# Patient Record
Sex: Male | Born: 1955 | Race: White | Hispanic: No | Smoking: Former smoker
Health system: Southern US, Community
[De-identification: ages and names within clinical notes are randomized; demographics above are authoritative.]

## PROBLEM LIST (undated history)

## (undated) DIAGNOSIS — M1990 Unspecified osteoarthritis, unspecified site: Secondary | ICD-10-CM

## (undated) DIAGNOSIS — R569 Unspecified convulsions: Secondary | ICD-10-CM

## (undated) DIAGNOSIS — I10 Essential (primary) hypertension: Secondary | ICD-10-CM

## (undated) DIAGNOSIS — E785 Hyperlipidemia, unspecified: Secondary | ICD-10-CM

## (undated) DIAGNOSIS — L409 Psoriasis, unspecified: Secondary | ICD-10-CM

## (undated) DIAGNOSIS — Z87442 Personal history of urinary calculi: Secondary | ICD-10-CM

## (undated) DIAGNOSIS — Z9289 Personal history of other medical treatment: Secondary | ICD-10-CM

## (undated) DIAGNOSIS — I669 Occlusion and stenosis of unspecified cerebral artery: Secondary | ICD-10-CM

## (undated) DIAGNOSIS — K219 Gastro-esophageal reflux disease without esophagitis: Secondary | ICD-10-CM

## (undated) HISTORY — PX: OTHER SURGICAL HISTORY: SHX169

## (undated) HISTORY — PX: SHOULDER ARTHROTOMY: SHX1050

---

## 2011-10-15 ENCOUNTER — Encounter (HOSPITAL_COMMUNITY): Payer: Self-pay | Admitting: Pharmacy Technician

## 2011-10-17 ENCOUNTER — Other Ambulatory Visit: Payer: Self-pay | Admitting: Orthopedic Surgery

## 2011-10-17 NOTE — Progress Notes (Signed)
Dr. Ave Filter : When you are able to, we need orders put into EPIC for [name] [name]  DOS 10/23/11  He is coming for preop TUES 10/21/11 Thank You --Lucien Mons  PST

## 2011-10-21 ENCOUNTER — Ambulatory Visit (HOSPITAL_COMMUNITY)
Admission: RE | Admit: 2011-10-21 | Discharge: 2011-10-21 | Disposition: A | Discharge status: Home / Self Care | Payer: Non-veteran care | Source: Ambulatory Visit | Attending: Orthopedic Surgery | Admitting: Orthopedic Surgery

## 2011-10-21 ENCOUNTER — Encounter (HOSPITAL_COMMUNITY)
Admission: RE | Admit: 2011-10-21 | Discharge: 2011-10-21 | Disposition: A | Discharge status: Home / Self Care | Payer: Non-veteran care | Source: Ambulatory Visit | Attending: Orthopedic Surgery | Admitting: Orthopedic Surgery

## 2011-10-21 ENCOUNTER — Ambulatory Visit (HOSPITAL_COMMUNITY): Payer: Self-pay

## 2011-10-21 ENCOUNTER — Encounter (HOSPITAL_COMMUNITY): Payer: Self-pay

## 2011-10-21 DIAGNOSIS — Z01810 Encounter for preprocedural cardiovascular examination: Secondary | ICD-10-CM | POA: Insufficient documentation

## 2011-10-21 DIAGNOSIS — M19019 Primary osteoarthritis, unspecified shoulder: Secondary | ICD-10-CM | POA: Insufficient documentation

## 2011-10-21 DIAGNOSIS — Z01812 Encounter for preprocedural laboratory examination: Secondary | ICD-10-CM | POA: Insufficient documentation

## 2011-10-21 HISTORY — DX: Unspecified osteoarthritis, unspecified site: M19.90

## 2011-10-21 HISTORY — DX: Hyperlipidemia, unspecified: E78.5

## 2011-10-21 HISTORY — DX: Psoriasis, unspecified: L40.9

## 2011-10-21 HISTORY — DX: Essential (primary) hypertension: I10

## 2011-10-21 HISTORY — DX: Personal history of other medical treatment: Z92.89

## 2011-10-21 HISTORY — DX: Unspecified convulsions: R56.9

## 2011-10-21 HISTORY — DX: Personal history of urinary calculi: Z87.442

## 2011-10-21 LAB — SURGICAL PCR SCREEN: MRSA, PCR: POSITIVE — AB

## 2011-10-21 LAB — CBC WITH DIFFERENTIAL/PLATELET
Eosinophils Absolute: 0.1 10*3/uL (ref 0.0–0.7)
Eosinophils Relative: 2 % (ref 0–5)
Hemoglobin: 15.4 g/dL (ref 13.0–17.0)
Lymphocytes Relative: 38 % (ref 12–46)
Lymphs Abs: 2.6 10*3/uL (ref 0.7–4.0)
MCH: 28.7 pg (ref 26.0–34.0)
MCV: 83.4 fL (ref 78.0–100.0)
Monocytes Relative: 8 % (ref 3–12)
Platelets: 297 10*3/uL (ref 150–400)
RBC: 5.36 MIL/uL (ref 4.22–5.81)
WBC: 6.8 10*3/uL (ref 4.0–10.5)

## 2011-10-21 LAB — PROTIME-INR: Prothrombin Time: 14.3 seconds (ref 11.6–15.2)

## 2011-10-21 LAB — COMPREHENSIVE METABOLIC PANEL
ALT: 38 U/L (ref 0–53)
Alkaline Phosphatase: 63 U/L (ref 39–117)
BUN: 11 mg/dL (ref 6–23)
CO2: 30 mEq/L (ref 19–32)
Calcium: 10.4 mg/dL (ref 8.4–10.5)
GFR calc Af Amer: >90 mL/min (ref >90)
GFR calc non Af Amer: 89 mL/min — ABNORMAL LOW (ref >90)
Glucose, Bld: 109 mg/dL — ABNORMAL HIGH (ref 70–99)
Potassium: 3.7 mEq/L (ref 3.5–5.1)
Sodium: 138 mEq/L (ref 135–145)

## 2011-10-21 LAB — URINALYSIS, ROUTINE W REFLEX MICROSCOPIC
Bilirubin Urine: NEGATIVE
Glucose, UA: NEGATIVE mg/dL
Hgb urine dipstick: NEGATIVE
Ketones, ur: NEGATIVE mg/dL
Protein, ur: NEGATIVE mg/dL
Urobilinogen, UA: 0.2 mg/dL (ref 0.0–1.0)

## 2011-10-21 LAB — ABO/RH: ABO/RH(D): B POS

## 2011-10-21 NOTE — Progress Notes (Signed)
10/21/11 1652  OBSTRUCTIVE SLEEP APNEA  Have you ever been diagnosed with sleep apnea through a sleep study? No  Do you snore loudly (loud enough to be heard through closed doors)?  0  Do you often feel tired, fatigued, or sleepy during the daytime? 0  Has anyone observed you stop breathing during your sleep? 0  Do you have, or are you being treated for high blood pressure? 1  BMI more than 35 kg/m2? 0  Age over 58 years old? 1  Neck circumference greater than 40 cm/18 inches? 1  Gender: 1  Obstructive Sleep Apnea Score 4   Score 4 or greater  Results sent to PCP

## 2011-10-21 NOTE — Progress Notes (Signed)
I spoke with Dr. Payton Doughty  concerning EKG and pt's blackout spells. He said pt will be evaluated the day of surgery.

## 2011-10-21 NOTE — Patient Instructions (Addendum)
20 [name] [name]  10/21/2011   Your procedure is scheduled on: 10/23/11 AT 11:30 AM   Report to [facility]  AT: 9:00 AM.  Call this number if you have problems the morning of surgery: [phone]   Remember:   Do not eat food or drink liquids AFTER MIDNIGHT   Take these medicines the morning of surgery with A SIP OF WATER: NONE   Do not wear jewelry, make-up   Do not wear lotions, powders, or perfumes.   Do not shave legs or underarms 12 hrs. before surgery (men may shave face)  Do not bring valuables to the hospital.  Contacts, dentures or bridgework may not be worn into surgery.  Leave suitcase in the car. After surgery it may be brought to your room.  For patients admitted to the hospital more than one night, checkout time is 11:00 AM the day of discharge.   Patients discharged the day of surgery will not be allowed to drive home If going home same day of surgery, must have someone stay with you first  24 hrs at home and arrange for some one to drive you home from hospital.    Special Instructions:   Please read over the following fact sheets that you were given:               1. MRSA  INFORMATION                      2. [place] PREPARING FOR SURGERY SHEET                  3. INCENTIVE SPIROMETER                                  X_______________________________________________________________

## 2011-10-23 ENCOUNTER — Inpatient Hospital Stay (HOSPITAL_COMMUNITY): Payer: Non-veteran care

## 2011-10-23 ENCOUNTER — Inpatient Hospital Stay (HOSPITAL_COMMUNITY)
Admission: RE | Admit: 2011-10-23 | Discharge: 2011-10-25 | DRG: 484 | Disposition: A | Discharge status: Home / Self Care | Payer: Non-veteran care | Source: Ambulatory Visit | Attending: Orthopedic Surgery | Admitting: Orthopedic Surgery

## 2011-10-23 ENCOUNTER — Encounter (HOSPITAL_COMMUNITY)
Admission: RE | Disposition: A | Discharge status: Home / Self Care | Payer: Self-pay | Source: Ambulatory Visit | Attending: Orthopedic Surgery

## 2011-10-23 ENCOUNTER — Encounter (HOSPITAL_COMMUNITY): Payer: Self-pay | Admitting: Anesthesiology

## 2011-10-23 ENCOUNTER — Encounter (HOSPITAL_COMMUNITY): Payer: Self-pay | Admitting: *Deleted

## 2011-10-23 ENCOUNTER — Ambulatory Visit (HOSPITAL_COMMUNITY): Payer: Non-veteran care | Admitting: Anesthesiology

## 2011-10-23 DIAGNOSIS — E785 Hyperlipidemia, unspecified: Secondary | ICD-10-CM | POA: Diagnosis present

## 2011-10-23 DIAGNOSIS — Z23 Encounter for immunization: Secondary | ICD-10-CM

## 2011-10-23 DIAGNOSIS — Z7982 Long term (current) use of aspirin: Secondary | ICD-10-CM

## 2011-10-23 DIAGNOSIS — I10 Essential (primary) hypertension: Secondary | ICD-10-CM | POA: Diagnosis present

## 2011-10-23 DIAGNOSIS — Z79899 Other long term (current) drug therapy: Secondary | ICD-10-CM

## 2011-10-23 DIAGNOSIS — E119 Type 2 diabetes mellitus without complications: Secondary | ICD-10-CM | POA: Diagnosis present

## 2011-10-23 DIAGNOSIS — Z87442 Personal history of urinary calculi: Secondary | ICD-10-CM

## 2011-10-23 DIAGNOSIS — L408 Other psoriasis: Secondary | ICD-10-CM | POA: Diagnosis present

## 2011-10-23 DIAGNOSIS — M25519 Pain in unspecified shoulder: Secondary | ICD-10-CM | POA: Diagnosis present

## 2011-10-23 DIAGNOSIS — M19019 Primary osteoarthritis, unspecified shoulder: Principal | ICD-10-CM | POA: Diagnosis present

## 2011-10-23 DIAGNOSIS — M19011 Primary osteoarthritis, right shoulder: Secondary | ICD-10-CM | POA: Diagnosis present

## 2011-10-23 HISTORY — PX: TOTAL SHOULDER ARTHROPLASTY: SHX126

## 2011-10-23 LAB — GLUCOSE, CAPILLARY: Glucose-Capillary: 111 mg/dL — ABNORMAL HIGH (ref 70–99)

## 2011-10-23 SURGERY — ARTHROPLASTY, SHOULDER, TOTAL
Anesthesia: General | Site: Shoulder | Laterality: Right | Wound class: Clean

## 2011-10-23 MED ORDER — LACTATED RINGERS IV SOLN: INTRAVENOUS | Status: DC

## 2011-10-23 MED ORDER — METOCLOPRAMIDE HCL 10 MG PO TABS: 5.0000 mg | ORAL_TABLET | Freq: Three times a day (TID) | ORAL | Status: DC | PRN

## 2011-10-23 MED ORDER — FENTANYL CITRATE 0.05 MG/ML IJ SOLN
INTRAMUSCULAR | Status: DC | PRN
  Administered 2011-10-23: 50 ug via INTRAVENOUS
  Administered 2011-10-23: 150 ug via INTRAVENOUS

## 2011-10-23 MED ORDER — ACETAMINOPHEN 10 MG/ML IV SOLN
INTRAVENOUS | Status: DC | PRN
  Administered 2011-10-23: 1000 mg via INTRAVENOUS

## 2011-10-23 MED ORDER — LISINOPRIL 20 MG PO TABS
20.0000 mg | ORAL_TABLET | Freq: Every day | ORAL | Status: DC
  Administered 2011-10-24 – 2011-10-25 (×2): 20 mg via ORAL
  Filled 2011-10-23 (×2): qty 1

## 2011-10-23 MED ORDER — MENTHOL 3 MG MT LOZG: 1.0000 | LOZENGE | OROMUCOSAL | Status: DC | PRN

## 2011-10-23 MED ORDER — DOCUSATE SODIUM 100 MG PO CAPS
100.0000 mg | ORAL_CAPSULE | Freq: Two times a day (BID) | ORAL | Status: DC
  Administered 2011-10-23 – 2011-10-25 (×4): 100 mg via ORAL

## 2011-10-23 MED ORDER — SODIUM CHLORIDE 0.9 % IV SOLN
INTRAVENOUS | Status: DC
  Administered 2011-10-23 – 2011-10-24 (×2): via INTRAVENOUS

## 2011-10-23 MED ORDER — ZOLPIDEM TARTRATE 5 MG PO TABS: 5.0000 mg | ORAL_TABLET | Freq: Every evening | ORAL | Status: DC | PRN

## 2011-10-23 MED ORDER — POVIDONE-IODINE 7.5 % EX SOLN: Freq: Once | CUTANEOUS | Status: DC

## 2011-10-23 MED ORDER — ONDANSETRON HCL 4 MG/2ML IJ SOLN: 4.0000 mg | Freq: Four times a day (QID) | INTRAMUSCULAR | Status: DC | PRN

## 2011-10-23 MED ORDER — BISACODYL 5 MG PO TBEC: 5.0000 mg | DELAYED_RELEASE_TABLET | Freq: Every day | ORAL | Status: DC | PRN

## 2011-10-23 MED ORDER — HYDROCHLOROTHIAZIDE 25 MG PO TABS
25.0000 mg | ORAL_TABLET | Freq: Every day | ORAL | Status: DC
  Administered 2011-10-24 – 2011-10-25 (×2): 25 mg via ORAL
  Filled 2011-10-23 (×2): qty 1

## 2011-10-23 MED ORDER — BACITRACIN ZINC 500 UNIT/GM EX OINT
TOPICAL_OINTMENT | CUTANEOUS | Status: AC
  Filled 2011-10-23: qty 15

## 2011-10-23 MED ORDER — DIPHENHYDRAMINE HCL 12.5 MG/5ML PO ELIX: 12.5000 mg | ORAL_SOLUTION | ORAL | Status: DC | PRN

## 2011-10-23 MED ORDER — ALUM & MAG HYDROXIDE-SIMETH 200-200-20 MG/5ML PO SUSP: 30.0000 mL | ORAL | Status: DC | PRN

## 2011-10-23 MED ORDER — PROMETHAZINE HCL 25 MG/ML IJ SOLN: 6.2500 mg | INTRAMUSCULAR | Status: DC | PRN

## 2011-10-23 MED ORDER — ROPIVACAINE HCL 5 MG/ML IJ SOLN
INTRAMUSCULAR | Status: AC
  Filled 2011-10-23: qty 30

## 2011-10-23 MED ORDER — DEXAMETHASONE SODIUM PHOSPHATE 10 MG/ML IJ SOLN
INTRAMUSCULAR | Status: DC | PRN
  Administered 2011-10-23: 10 mg via INTRAVENOUS

## 2011-10-23 MED ORDER — SODIUM CHLORIDE 0.9 % IR SOLN
Status: DC | PRN
  Administered 2011-10-23: 3000 mL

## 2011-10-23 MED ORDER — HYDROMORPHONE HCL PF 1 MG/ML IJ SOLN: 0.2500 mg | INTRAMUSCULAR | Status: DC | PRN

## 2011-10-23 MED ORDER — MUPIROCIN 2 % EX OINT
TOPICAL_OINTMENT | Freq: Two times a day (BID) | CUTANEOUS | Status: DC
  Administered 2011-10-23: 1 via NASAL
  Filled 2011-10-23: qty 22

## 2011-10-23 MED ORDER — MORPHINE SULFATE 2 MG/ML IJ SOLN: 2.0000 mg | INTRAMUSCULAR | Status: DC | PRN

## 2011-10-23 MED ORDER — ROPIVACAINE HCL 5 MG/ML IJ SOLN
INTRAMUSCULAR | Status: DC | PRN
  Administered 2011-10-23: 30 mL via EPIDURAL

## 2011-10-23 MED ORDER — ACETAMINOPHEN 10 MG/ML IV SOLN
INTRAVENOUS | Status: AC
  Filled 2011-10-23: qty 100

## 2011-10-23 MED ORDER — METHOCARBAMOL 500 MG PO TABS
500.0000 mg | ORAL_TABLET | Freq: Four times a day (QID) | ORAL | Status: DC | PRN
  Administered 2011-10-25: 500 mg via ORAL
  Filled 2011-10-23: qty 1

## 2011-10-23 MED ORDER — PROPOFOL 10 MG/ML IV BOLUS
INTRAVENOUS | Status: DC | PRN
  Administered 2011-10-23: 200 mg via INTRAVENOUS

## 2011-10-23 MED ORDER — ONDANSETRON HCL 4 MG PO TABS: 4.0000 mg | ORAL_TABLET | Freq: Four times a day (QID) | ORAL | Status: DC | PRN

## 2011-10-23 MED ORDER — LACTATED RINGERS IV SOLN
INTRAVENOUS | Status: DC
  Administered 2011-10-23 (×2): via INTRAVENOUS
  Administered 2011-10-23: 1000 mL via INTRAVENOUS

## 2011-10-23 MED ORDER — ATORVASTATIN CALCIUM 40 MG PO TABS
40.0000 mg | ORAL_TABLET | Freq: Every day | ORAL | Status: DC
  Administered 2011-10-24 – 2011-10-25 (×2): 40 mg via ORAL
  Filled 2011-10-23 (×2): qty 1

## 2011-10-23 MED ORDER — ONDANSETRON HCL 4 MG/2ML IJ SOLN
INTRAMUSCULAR | Status: DC | PRN
  Administered 2011-10-23: 4 mg via INTRAVENOUS

## 2011-10-23 MED ORDER — PHENOL 1.4 % MT LIQD: 1.0000 | OROMUCOSAL | Status: DC | PRN

## 2011-10-23 MED ORDER — CISATRACURIUM BESYLATE (PF) 10 MG/5ML IV SOLN
INTRAVENOUS | Status: DC | PRN
  Administered 2011-10-23: 12 mg via INTRAVENOUS

## 2011-10-23 MED ORDER — ACETAMINOPHEN 650 MG RE SUPP: 650.0000 mg | Freq: Four times a day (QID) | RECTAL | Status: DC | PRN

## 2011-10-23 MED ORDER — MIDAZOLAM HCL 5 MG/5ML IJ SOLN
INTRAMUSCULAR | Status: DC | PRN
  Administered 2011-10-23: 2 mg via INTRAVENOUS

## 2011-10-23 MED ORDER — THROMBIN 5000 UNITS EX SOLR
CUTANEOUS | Status: AC
  Filled 2011-10-23: qty 10000

## 2011-10-23 MED ORDER — BUPIVACAINE-EPINEPHRINE PF 0.25-1:200000 % IJ SOLN
INTRAMUSCULAR | Status: AC
  Filled 2011-10-23: qty 30

## 2011-10-23 MED ORDER — CEFAZOLIN SODIUM-DEXTROSE 2-3 GM-% IV SOLR
2.0000 g | INTRAVENOUS | Status: AC
  Administered 2011-10-23: 2 g via INTRAVENOUS

## 2011-10-23 MED ORDER — INFLUENZA VIRUS VACC SPLIT PF IM SUSP
0.5000 mL | INTRAMUSCULAR | Status: AC
  Administered 2011-10-24: 0.5 mL via INTRAMUSCULAR
  Filled 2011-10-23: qty 0.5

## 2011-10-23 MED ORDER — ACETAMINOPHEN 325 MG PO TABS
650.0000 mg | ORAL_TABLET | Freq: Four times a day (QID) | ORAL | Status: DC | PRN
  Filled 2011-10-23: qty 2

## 2011-10-23 MED ORDER — CEFAZOLIN SODIUM-DEXTROSE 2-3 GM-% IV SOLR
2.0000 g | Freq: Four times a day (QID) | INTRAVENOUS | Status: AC
  Administered 2011-10-23 – 2011-10-24 (×3): 2 g via INTRAVENOUS
  Filled 2011-10-23 (×3): qty 50

## 2011-10-23 MED ORDER — OXYCODONE-ACETAMINOPHEN 5-325 MG PO TABS
1.0000 | ORAL_TABLET | ORAL | Status: DC | PRN
  Administered 2011-10-23 – 2011-10-25 (×8): 2 via ORAL
  Filled 2011-10-23 (×9): qty 2

## 2011-10-23 MED ORDER — LISINOPRIL-HYDROCHLOROTHIAZIDE 20-25 MG PO TABS: 1.0000 | ORAL_TABLET | Freq: Every morning | ORAL | Status: DC

## 2011-10-23 MED ORDER — CEFAZOLIN SODIUM-DEXTROSE 2-3 GM-% IV SOLR
INTRAVENOUS | Status: AC
  Filled 2011-10-23: qty 50

## 2011-10-23 MED ORDER — METOCLOPRAMIDE HCL 5 MG/ML IJ SOLN: 5.0000 mg | Freq: Three times a day (TID) | INTRAMUSCULAR | Status: DC | PRN

## 2011-10-23 MED ORDER — SENNOSIDES-DOCUSATE SODIUM 8.6-50 MG PO TABS
1.0000 | ORAL_TABLET | Freq: Every evening | ORAL | Status: DC | PRN
  Filled 2011-10-23: qty 1

## 2011-10-23 MED ORDER — METHOCARBAMOL 100 MG/ML IJ SOLN
500.0000 mg | Freq: Four times a day (QID) | INTRAVENOUS | Status: DC | PRN
  Administered 2011-10-24 (×2): 500 mg via INTRAVENOUS
  Filled 2011-10-23 (×2): qty 5

## 2011-10-23 MED ORDER — ASPIRIN EC 325 MG PO TBEC
325.0000 mg | DELAYED_RELEASE_TABLET | Freq: Two times a day (BID) | ORAL | Status: DC
  Administered 2011-10-23 – 2011-10-25 (×4): 325 mg via ORAL
  Filled 2011-10-23 (×6): qty 1

## 2011-10-23 SURGICAL SUPPLY — 59 items
BAG ZIPLOCK 12X15 (MISCELLANEOUS) IMPLANT
BIT DRILL 1.6MX128 (BIT) ×2 IMPLANT
BLADE MIC 41X13 (BLADE) ×2 IMPLANT
BLADE SAW SAG 73X25 THK (BLADE) ×1
BLADE SAW SGTL 18X1.27X75 (BLADE) ×2 IMPLANT
BLADE SAW SGTL 73X25 THK (BLADE) ×1 IMPLANT
BLADE SURG 15 STRL LF DISP TIS (BLADE) ×2 IMPLANT
BLADE SURG 15 STRL SS (BLADE) ×2
CEMENT HV SMART SET (Cement) ×2 IMPLANT
CLOTH BEACON ORANGE TIMEOUT ST (SAFETY) ×2 IMPLANT
COVER SURGICAL LIGHT HANDLE (MISCELLANEOUS) ×2 IMPLANT
DEPUY CMW 1 BONE CEMENT ×2 IMPLANT
DRAPE INCISE IOBAN 66X45 STRL (DRAPES) ×4 IMPLANT
DRAPE ORTHO SPLIT 77X108 STRL (DRAPES) ×2
DRAPE POUCH INSTRU U-SHP 10X18 (DRAPES) ×2 IMPLANT
DRAPE SURG 17X11 SM STRL (DRAPES) ×2 IMPLANT
DRAPE SURG ORHT 6 SPLT 77X108 (DRAPES) ×2 IMPLANT
DRAPE U-SHAPE 47X51 STRL (DRAPES) ×2 IMPLANT
DRSG EMULSION OIL 3X16 NADH (GAUZE/BANDAGES/DRESSINGS) ×2 IMPLANT
DURAPREP 26ML APPLICATOR (WOUND CARE) IMPLANT
ELECT BLADE TIP CTD 4 INCH (ELECTRODE) ×2 IMPLANT
ELECT REM PT RETURN 9FT ADLT (ELECTROSURGICAL) ×2
ELECTRODE REM PT RTRN 9FT ADLT (ELECTROSURGICAL) ×1 IMPLANT
EVACUATOR 1/8 PVC DRAIN (DRAIN) ×2 IMPLANT
FACESHIELD LNG OPTICON STERILE (SAFETY) ×4 IMPLANT
GLOVE BIOGEL PI IND STRL 8 (GLOVE) ×1 IMPLANT
GLOVE BIOGEL PI INDICATOR 8 (GLOVE) ×1
GLOVE ORTHO TXT STRL SZ7.5 (GLOVE) ×4 IMPLANT
HANDPIECE INTERPULSE COAX TIP (DISPOSABLE) ×1
HEMOSTAT SURGICEL 4X8 (HEMOSTASIS) ×2 IMPLANT
HOOD SURGICAL BLUE (PROTECTIVE WEAR) ×4 IMPLANT
KIT BASIN OR (CUSTOM PROCEDURE TRAY) ×2 IMPLANT
MANIFOLD NEPTUNE II (INSTRUMENTS) ×2 IMPLANT
MCCONNELL ARM HOLDER ×2 IMPLANT
NEEDLE MA TROC 1/2 (NEEDLE) ×4 IMPLANT
NEEDLE MAYO .5 CIRCLE (NEEDLE) ×2 IMPLANT
NS IRRIG 1000ML POUR BTL (IV SOLUTION) IMPLANT
PACK SHOULDER CUSTOM OPM052 (CUSTOM PROCEDURE TRAY) ×2 IMPLANT
PASSER SUT SWANSON 36MM LOOP (INSTRUMENTS) ×2 IMPLANT
POSITIONER SURGICAL ARM (MISCELLANEOUS) ×2 IMPLANT
SET HNDPC FAN SPRY TIP SCT (DISPOSABLE) ×1 IMPLANT
SET PAD SHOULDER ACCESS (MISCELLANEOUS) IMPLANT
SLING ARM IMMOBILIZER LRG (SOFTGOODS) ×2 IMPLANT
SMARTMIX MINI TOWER (MISCELLANEOUS) ×2
SPONGE LAP 18X18 X RAY DECT (DISPOSABLE) ×4 IMPLANT
STAPLER VISISTAT 35W (STAPLE) ×2 IMPLANT
SUCTION FRAZIER 12FR DISP (SUCTIONS) ×2 IMPLANT
SUPPORT WRAP ARM LG (MISCELLANEOUS) ×2 IMPLANT
SUT ETHIBOND #5 BRAIDED 30INL (SUTURE) ×2 IMPLANT
SUT ETHIBOND 2 OS 4 DA (SUTURE) ×6 IMPLANT
SUT FIBERWIRE #2 38 T-5 BLUE (SUTURE)
SUT MNCRL AB 4-0 PS2 18 (SUTURE) ×2 IMPLANT
SUT VIC AB 0 CT1 36 (SUTURE) ×4 IMPLANT
SUT VIC AB 2-0 CT1 27 (SUTURE) ×2
SUT VIC AB 2-0 CT1 TAPERPNT 27 (SUTURE) ×2 IMPLANT
SUTURE FIBERWR #2 38 T-5 BLUE (SUTURE) IMPLANT
SYRINGE IRR TOOMEY STRL 70CC (SYRINGE) IMPLANT
TOWER SMARTMIX MINI (MISCELLANEOUS) ×1 IMPLANT
WATER STERILE IRR 1500ML POUR (IV SOLUTION) IMPLANT

## 2011-10-23 NOTE — H&P (Signed)
[name] [name] is an 56 y.o. male.   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   Past Medical History  Diagnosis Date  . Hypertension   . Hyperlipidemia   . Arthritis   . Psoriasis   . History of kidney stones   . History of blood transfusion   . Diabetes mellitus     DIET CONTROLLED  . Seizures     "BLACKOUTS" STARTED AT AGE 86 - NO EVALUATIONS HAVE EVER BEEN DONE  LAST "BLACKOUT" WAS IN 2007 HAS HAD SURGERY ON L SHOULDER SINCE WITH NO PROBLEMS     Past Surgical History  Procedure Date  . Testicles     UNDECTNDED TESTICLE  . Head injury AGE 4    REMOVAL OF BLOOD CLOT   . Fx tibia AGE 51    REPAIR OF FRACTURE  . Shoulder arthrotomy     REPAIR OF DISLOCATION    History reviewed. No pertinent family history. Social History:  reports that he quit smoking about 25 years ago. He does not have any smokeless tobacco history on file. He reports that he does not drink alcohol or use illicit drugs.  Allergies: No Known Allergies  Medications Prior to Admission  Medication Sig Dispense Refill  . aspirin EC 81 MG tablet Take 81 mg by mouth every morning.      Marland Kitchen atorvastatin (LIPITOR) 40 MG tablet Take 40 mg by mouth daily at 12 noon.      . fish oil-omega-3 fatty acids 1000 MG capsule Take 1 g by mouth daily.      Marland Kitchen lisinopril-hydrochlorothiazide (PRINZIDE,ZESTORETIC) 20-25 MG per tablet Take 1 tablet by mouth every morning.      . meloxicam (MOBIC) 7.5 MG tablet Take 7.5 mg by mouth 2 (two) times daily.        Results for orders placed during the hospital encounter of 10/23/11 (from the past 48 hour(s))  GLUCOSE, CAPILLARY     Status: Abnormal   Collection Time   10/23/11  9:38 AM      Component Value Range Comment   Glucose-Capillary 111 (*) 70 - 99 mg/dL    Dg Chest 2 View  2/95/6213  *RADIOLOGY REPORT*  Clinical Data: Preop shoulder surgery  CHEST - 2 VIEW  Comparison: None.  Findings: Heart size and vascularity  are normal.  Lungs are clear without infiltrate or effusion.  No mass lesion.  Surgical screw in the left scapula is fractured.  There is degenerative change in both shoulder joints.  IMPRESSION: No active cardiopulmonary disease.   Original Report Authenticated By: Camelia Phenes, M.D.     Review of Systems  All other systems reviewed and are negative.    Blood pressure 154/82, pulse 81, temperature 97.3 F (36.3 C), temperature source Oral, resp. rate 18, SpO2 99.00%. Physical Exam  Constitutional: He appears well-developed and well-nourished.  HENT:  Head: Atraumatic.  Eyes: EOM are normal.  Respiratory: Effort normal.  Musculoskeletal:       Right shoulder: He exhibits decreased range of motion and pain.  Neurological: He is alert.  Skin: Skin is warm and dry.  Psychiatric: He has a normal mood and affect.     Assessment/Plan R shoulder endstage OA, failed nonop tx Plan R shoulder total shoulder replacement Risks / benefits of surgery discussed Consent on chart  NPO for OR Preop antibiotics   [name] [name] 10/23/2011, 12:00 PM

## 2011-10-23 NOTE — Anesthesia Postprocedure Evaluation (Signed)
Anesthesia Post Note  Patient: [name] [name]  Procedure(s) Performed: Procedure(s) (LRB): TOTAL SHOULDER ARTHROPLASTY (Right)  Anesthesia type: General  Patient location: PACU  Post pain: Pain level controlled  Post assessment: Post-op Vital signs reviewed  Last Vitals:  Filed Vitals:   10/23/11 1533  BP:   Pulse: 86  Temp: 36.7 C  Resp: 14    Post vital signs: Reviewed  Level of consciousness: sedated  Complications: No apparent anesthesia complications

## 2011-10-23 NOTE — Anesthesia Procedure Notes (Signed)
Anesthesia Regional Block:  Interscalene brachial plexus block  Pre-Anesthetic Checklist: ,, timeout performed, Correct Patient, Correct Site, Correct Laterality, Correct Procedure, Correct Position, site marked, Risks and benefits discussed,  Surgical consent,  Pre-op evaluation,  At surgeon's request and post-op pain management  Laterality: Right  Prep: chloraprep       Needles:  Injection technique: Single-shot  Needle Type: Stimiplex          Additional Needles:  Procedures: ultrasound guided Interscalene brachial plexus block Narrative:  Start time: 10/23/2011 11:20 AM End time: 10/23/2011 11:25 AM Injection made incrementally with aspirations every 5 mL.  Performed by: Personally  Anesthesiologist: Lucille Passy MD  Additional Notes: Risks, benefits and alternative to block explained extensively.  Patient tolerated procedure well, without complications.  Interscalene brachial plexus block

## 2011-10-23 NOTE — Op Note (Signed)
Procedure(s): TOTAL SHOULDER ARTHROPLASTY Procedure Note  [name] [name] male 56 y.o. 10/23/2011  Procedure(s) and Anesthesia Type:    * RIGHT TOTAL SHOULDER ARTHROPLASTY - General with preoperative interscalene block  Postoperative diagnosis: Right shoulder endstage osteoarthritis  Surgeon(s) and Role:    * Mable Paris, MD - Primary   Indications:  56 y.o. male  With endstage right shoulder bone on bone arthritis. Pain and dysfunction interfered with quality of life and nonoperative treatment with activity modification, NSAIDS and injections failed.     Surgeon: Mable Paris   Assistants: Damita Lack PA-C (Danielle was scrubbed throughout the procedure and was essential for exposure retraction and closure)  Anesthesia: General endotracheal anesthesia with preoperative interscalene    Procedure Detail  TOTAL SHOULDER ARTHROPLASTY  Findings: DePuy press-fit pore coat size 12 stem with a 52 x 21 centered head and a 52 cemented APG glenoid. A lesser tuberosity osteotomy was performed. A biconcave glenoid was corrected by eccentrically reaming anteriorly. Stability was excellent.  Estimated Blood Loss:  300 mL         Drains: 1 medium hemovac  Blood Given: none          Specimens: none        Complications:  * No complications entered in OR log *         Disposition: PACU - hemodynamically stable.         Condition: stable    Procedure:   The patient was identified in the preoperative holding area where I personally marked the operative extremity after verifying with the patient and consent. He  was taken to the operating room where He was transferred to the   operative table.  The patient received an interscalene block in   the holding area by the attending anesthesiologist.  General anesthesia was induced   in the operating room without complication.  The patient did receive IV  Ancef prior to the commencement of the procedure.  The  patient was   placed in the beach-chair position with the back raised about 30   degrees.  The nonoperative extremity and head and neck were carefully   positioned and padded protecting against neurovascular compromise.  The   left upper extremity was then prepped and draped in the standard sterile   fashion.    The appropriate operative time-out was performed with   Anesthesia, the perioperative staff, as well as myself and we all agreed   that the right side was the correct operative site.  An approximately   10 cm incision was made from the tip of the coracoid to the center point of the   humerus at the level of the axilla.  Dissection was carried down sharply   through subcutaneous tissues and cephalic vein was identified and taken   laterally with the deltoid.  The pectoralis major was taken medially.  The   upper 1 cm of the pectoralis major was released from its attachment on   the humerus.  The clavipectoral fascia was incised just lateral to the   conjoined tendon.  This incision was carried up to but not into the   coracoacromial ligament.  Digital palpation was used to prove   integrity of the axillary nerve which was protected throughout the   procedure.  Musculocutaneous nerve was not palpated in the operative   field.  Conjoined tendon was then retracted gently medially and the   deltoid laterally.  Anterior circumflex humeral vessels were clamped and  coagulated.  The soft tissues overlying the biceps was incised and this   incision was carried across the transverse humeral ligament to the base   of the coracoid.  The biceps was tenodesed to the soft tissue just above   pectoralis major and the remaining portion of the biceps superiorly was   excised.  An osteotomy was performed at the lesser tuberosity and the   subscapularis was freed from the underlying capsule.  Capsule was then   released all the way down to the 6 o'clock position of the humeral head.   The humeral  head was then delivered with simultaneous adduction,   extension and external rotation.  All humeral osteophytes were removed   and the anatomic neck of the humerus was marked and cut free hand at   approximately 25 degrees retroversion within about 3 mm of the cuff   reflection posteriorly.  The head size was estimated to be a 52 medium   offset.  At that point, the humeral head was retracted posteriorly with   a Fukuda retractor and the anterior-inferior capsule was excised.   Remaining portion of the capsule was released at the base of the   coracoid.  The remaining biceps anchor and the entire anterior-inferior   labrum was excised.  The posterior labrum was also excised but the   posterior capsule was not released.  The guidepin was placed bicortically with 3 mm elevated guide.  The reamer was used to ream to concentric bone with punctate bleeding.  This gave an excellent concentric surface.  The center hole was then drilled for an anchor peg glenoid followed by the three peripheral holes and none of the holes   exited the glenoid wall.  I then pulse irrigated these holes and dried   them with Surgicel and thrombin.  The three peripheral holes were then   pressurized cemented and the anchor peg glenoid was placed and impacted   with an excellent fit.  The glenoid was a 52 component. Several large osteochondral loose bodies were removed in the process of preparing the glenoid.  The proximal humerus was then again exposed taking care not to displace the glenoid.    The humerus was then sequentially reamed going from 6 to 12 by 2 mm incriments. The 12 mm reamer was found to have appropriate cortical contact.  A   box osteotome was then used and a 12-mm broach.  The broach handle was   removed and the trial head was placed.   Calcar reamer was used.The centered 52x 21 head fit best.  With the trial implantation of the component, there was   approximately 50% posterior translation with immediate  snap back to the   anatomic position.  With forward elevation, there was no tendency   towards posterior subluxation.   The trial was removed and the final implant was prepared on a back table.  The implant was then impacted and   achieved excellent anatomic reconstruction of the proximal humerus.  #2 Ethibond was placed around the neck prior to impaction.  The joint   was then copiously irrigated with pulse lavage.  The integrity of the axillary nerve was verified with digital palpation The subscapularis and   lesser tuberosity osteotomy were then repaired using 2 #2 Ethibonds   and the #2 Ethibond around the neck of the implant in a double row type   repair.  One #1 Ethibond was placed at the rotator interval just above  the lesser tuberosity.  After repair of the lesser tuberosity, a medium   Hemovac was placed out anterolaterally and again copious irrigation was   used.   Skin was closed with 2-0 Vicryl sutures in the deep dermal layer and 4-0 Monocryl in a subcuticular  running fashion.  Sterile dressings were then applied including Steri- Strips, 4x4s, ABDs and tape.  The patient was placed in a sling and allowed to awaken from general anesthesia and taken to the recovery room in stable  condition.      POSTOPERATIVE PLAN:  Early passive range of motion will be allowed with the goal of 30 degrees external rotation and a 130 degrees forward elevation.  No internal rotation at this time.  No active motion of the arm until the lesser tuberosity heels.  The patient will likely be kept in the hospital for 2 days and then discharged home.

## 2011-10-23 NOTE — Transfer of Care (Signed)
Immediate Anesthesia Transfer of Care Note  Patient: [name] [name]  Procedure(s) Performed: Procedure(s) (LRB) with comments: TOTAL SHOULDER ARTHROPLASTY (Right)  Patient Location: PACU  Anesthesia Type: General  Level of Consciousness: awake, sedated and patient cooperative  Airway & Oxygen Therapy: Patient Spontanous Breathing and Patient connected to face mask oxygen  Post-op Assessment: Report given to PACU RN and Post -op Vital signs reviewed and stable  Post vital signs: Reviewed and stable  Complications: No apparent anesthesia complications

## 2011-10-23 NOTE — Anesthesia Preprocedure Evaluation (Addendum)
Anesthesia Evaluation  Patient identified by MRN, date of birth, ID band Patient awake    Reviewed: Allergy & Precautions, H&P , NPO status , Patient's Chart, lab work & pertinent test results  Airway Mallampati: II TM Distance: >3 FB Neck ROM: Full    Dental  (+) Teeth Intact and Dental Advisory Given,    Pulmonary neg pulmonary ROS,  breath sounds clear to auscultation  Pulmonary exam normal       Cardiovascular hypertension, Pt. on medications Rhythm:Regular Rate:Normal     Neuro/Psych Seizures -,  negative psych ROS   GI/Hepatic negative GI ROS, Neg liver ROS,   Endo/Other  diabetes (diet control)  Renal/GU negative Renal ROS  negative genitourinary   Musculoskeletal negative musculoskeletal ROS (+)   Abdominal   Peds negative pediatric ROS (+)  Hematology negative hematology ROS (+)   Anesthesia Other Findings   Reproductive/Obstetrics negative OB ROS                          Anesthesia Physical Anesthesia Plan  ASA: I  Anesthesia Plan: General   Post-op Pain Management:    Induction: Intravenous  Airway Management Planned: Oral ETT  Additional Equipment:   Intra-op Plan:   Post-operative Plan: Extubation in OR  Informed Consent: I have reviewed the patients History and Physical, chart, labs and discussed the procedure including the risks, benefits and alternatives for the proposed anesthesia with the patient or authorized representative who has indicated his/her understanding and acceptance.     Plan Discussed with: CRNA  Anesthesia Plan Comments:         Anesthesia Quick Evaluation

## 2011-10-24 LAB — BASIC METABOLIC PANEL
BUN: 10 mg/dL (ref 6–23)
CO2: 24 mEq/L (ref 19–32)
Calcium: 8.3 mg/dL — ABNORMAL LOW (ref 8.4–10.5)
Creatinine, Ser: 0.8 mg/dL (ref 0.50–1.35)
Glucose, Bld: 133 mg/dL — ABNORMAL HIGH (ref 70–99)
Sodium: 136 mEq/L (ref 135–145)

## 2011-10-24 LAB — CBC
HCT: 32.7 % — ABNORMAL LOW (ref 39.0–52.0)
Hemoglobin: 11.5 g/dL — ABNORMAL LOW (ref 13.0–17.0)
MCH: 29.1 pg (ref 26.0–34.0)
MCV: 82.8 fL (ref 78.0–100.0)
RBC: 3.95 MIL/uL — ABNORMAL LOW (ref 4.22–5.81)

## 2011-10-24 MED ORDER — OXYCODONE-ACETAMINOPHEN 5-325 MG PO TABS: 1.0000 | ORAL_TABLET | ORAL | Status: DC | PRN

## 2011-10-24 NOTE — Progress Notes (Signed)
Utilization review completed.  

## 2011-10-24 NOTE — Evaluation (Signed)
Occupational Therapy Evaluation Patient Details Name: Moultrie Omary MRN: [id number] DOB: March 06, 1955 Today's Date: 10/24/2011 Time: 1310-1340 OT Time Calculation (min): 30 min  OT Assessment / Plan / Recommendation Clinical Impression  Pt is s/p R shoulder arthrplasty and is tolerating exercises and ADL well. All education provided through either verbal education, demonstration/hands on practice or handout. No further questions and girlfriend can assist PRN.     OT Assessment  Patient does not need any further OT services    Follow Up Recommendations  No OT follow up;Other (comment) (until MD orders)    Barriers to Discharge      Equipment Recommendations  None recommended by OT    Recommendations for Other Services    Frequency       Precautions / Restrictions Precautions Precautions: Shoulder Type of Shoulder Precautions: with a dowel perform R UE external rotation to 30 degrees and forward flexion using opposite arm to raise operated arm to 130 degrees.  Precaution Comments: Issued arm care handout and reviewed with pt Required Braces or Orthoses: Other Brace/Splint Other Brace/Splint: R shoulder sling Restrictions Weight Bearing Restrictions: Yes RUE Weight Bearing: Non weight bearing        ADL       OT Diagnosis:    OT Problem List:   OT Treatment Interventions:     OT Goals    Visit Information  Last OT Received On: 10/24/11 Assistance Needed: +1    Subjective Data  Subjective: I was getting ready to go back to bed Patient Stated Goal: none stated. agreeable to work with OT   Prior Functioning     Home Living Lives With: Other (Comment) (girlfriend) Available Help at Discharge: Other (Comment) (girlfriend)         Vision/Perception     Cognition       Extremity/Trunk Assessment       Mobility       Shoulder Instructions Donning/doffing shirt without moving shoulder: Patient able to independently direct caregiver Method for sponge  bathing under operated UE: Patient able to independently direct caregiver Donning/doffing sling/immobilizer: Patient able to independently direct caregiver Correct positioning of sling/immobilizer: Patient able to independently direct caregiver ROM for elbow, wrist and digits of operated UE: Independent Sling wearing schedule (on at all times/off for ADL's): Independent Proper positioning of operated UE when showering: Independent Positioning of UE while sleeping: Patient able to independently direct caregiver   Exercise Other Exercises Other Exercises: Pt performed forward flexion using opposite arm to raise R UE to about 90 degrees for 4-5 reps. Min verbal and demo cues for correct technique. Pt then used shoe horn as a dowel to perform external rotation of R UE to 30 degrees for 5 reps. Explained to gradual work up to limits of ROM given by MD and number of reps also. Issued handout with verbal instruction and pictures to show limits of ROM (130 forward flexion and 30 degrees external rotation). Pt verbalized understanding of all exercises and ADL.    Balance     End of Session OT - End of Session Activity Tolerance: Patient tolerated treatment well Patient left: in bed;with call bell/phone within reach  GO     Lennox Laity [phone] 10/24/2011, 2:29 PM

## 2011-10-24 NOTE — Progress Notes (Signed)
CARE MANAGEMENT NOTE 10/24/2011  Patient:  [name],[name]   Account Number:  [id number]  Date Initiated:  10/24/2011  Documentation initiated by:  Colleen Can  Subjective/Objective Assessment:   dx rt shoulder osteoarthritis . rt shoulder arthroplasty     Action/Plan:   CM spoke with patient. Plans are for pt to return to his home where girl friend will be caregiver. He has sling in place. There are no HH needs at this time   Anticipated DC Date:  10/25/2011   Anticipated DC Plan:  HOME/SELF CARE  In-house referral  NA      DC Planning Services  CM consult      PAC Choice  NA   Choice offered to / List presented to:  NA   DME arranged  NA      DME agency  NA     HH arranged  NA      HH agency  NA   Status of service:  Completed, signed off Medicare Important Message given?  NO (If response is "NO", the following Medicare IM given date fields will be blank) Date Medicare IM given:   Date Additional Medicare IM given:

## 2011-10-24 NOTE — Progress Notes (Signed)
PATIENT ID: [name] [name]   1 Day Post-Op Procedure(s) (LRB): TOTAL SHOULDER ARTHROPLASTY (Right)  Subjective: Doing well, block just wore off. Pain ok with perc.  Objective:  Filed Vitals:   10/24/11 0645  BP: 148/88  Pulse: 94  Temp: 98.5 F (36.9 C)  Resp: 16     RUE dressing c/d/i Drain d/c'd NVID  Labs:   Basename 10/24/11 0410 10/21/11 1300  HGB 11.5* 15.4   Basename 10/24/11 0410 10/21/11 1300  WBC 12.9* 6.8  RBC 3.95* 5.36  HCT 32.7* 44.7  PLT 224 297   Basename 10/24/11 0410 10/21/11 1300  NA 136 138  K 3.5 3.7  CL 100 96  CO2 24 30  BUN 10 11  CREATININE 0.80 0.98  GLUCOSE 133* 109*  CALCIUM 8.3* 10.4    Assessment and Plan:Doing well OT today Dressing change tomorrow, then home   VTE proph: ECASA BID and SCDs

## 2011-10-25 DIAGNOSIS — M19011 Primary osteoarthritis, right shoulder: Secondary | ICD-10-CM | POA: Diagnosis present

## 2011-10-25 NOTE — Progress Notes (Signed)
Subjective: 2 Days Post-Op Procedure(s) (LRB): TOTAL SHOULDER ARTHROPLASTY (Right) Patient reports pain as 3 on 0-10 scale.   Taking po/voiding ok. Good pain relief with oral meds.  Objective: Vital signs in last 24 hours: Temp:  [97.7 F (36.5 C)-98.4 F (36.9 C)] 97.7 F (36.5 C) (09/28 0533) Pulse Rate:  [87-100] 87  (09/28 0533) Resp:  [16-18] 16  (09/28 0533) BP: (123-156)/(74-76) 123/76 mmHg (09/28 0533) SpO2:  [95 %-96 %] 95 % (09/28 0533)  Intake/Output from previous day: 09/27 0701 - 09/28 0700 In: 1713.8 [P.O.:360; I.V.:1353.8] Out: 2725 [Urine:2725] Intake/Output this shift: Total I/O In: 240 [P.O.:240] Out: 250 [Urine:250]   Basename 10/24/11 0410  HGB 11.5*    Basename 10/24/11 0410  WBC 12.9*  RBC 3.95*  HCT 32.7*  PLT 224    Basename 10/24/11 0410  NA 136  K 3.5  CL 100  CO2 24  BUN 10  CREATININE 0.80  GLUCOSE 133*  CALCIUM 8.3*   No results found for this basename: LABPT:2,INR:2 in the last 72 hours Right shoulder exam: Neurovascular intact Sensation intact distally Intact pulses distally Incision: scant drainage No sx of infection.  Assessment/Plan: 2 Days Post-Op Procedure(s) (LRB): TOTAL SHOULDER ARTHROPLASTY (Right) PLAN: Dressing changed. D/C IV. Discharge home. F/U Dr Ave Filter in 10-14 days.  Viridiana Spaid G 10/25/2011, 1:55 PM

## 2011-10-25 NOTE — Discharge Summary (Signed)
Patient ID: [name] [name] MRN: [id number] DOB/AGE: 09-02-1955 56 y.o.  Admit date: 10/23/2011 Discharge date: 10/25/2011  Admission Diagnoses:  Principal Problem:  *Primary osteoarthritis of right shoulder   Discharge Diagnoses:  Same  Past Medical History  Diagnosis Date  . Hypertension   . Hyperlipidemia   . Arthritis   . Psoriasis   . History of kidney stones   . History of blood transfusion   . Diabetes mellitus     DIET CONTROLLED  . Seizures     "BLACKOUTS" STARTED AT AGE 67 - NO EVALUATIONS HAVE EVER BEEN DONE  LAST "BLACKOUT" WAS IN 2007 HAS HAD SURGERY ON L SHOULDER SINCE WITH NO PROBLEMS     Surgeries: Procedure(s):Right TOTAL SHOULDER ARTHROPLASTY on 10/23/2011    Discharged Condition: Improved  Hospital Course: [name] [name] is an 56 y.o. male who was admitted 10/23/2011 for operative treatment ofPrimary osteoarthritis of right shoulder. Patient has severe unremitting pain that affects sleep, daily activities, and work/hobbies. After pre-op clearance the patient was taken to the operating room on 10/23/2011 and underwent  Procedure(s): TOTAL SHOULDER ARTHROPLASTY.    Patient was given perioperative antibiotics: Anti-infectives     Start     Dose/Rate Route Frequency Ordered Stop   10/23/11 1800   ceFAZolin (ANCEF) IVPB 2 g/50 mL premix        2 g 100 mL/hr over 30 Minutes Intravenous Every 6 hours 10/23/11 1645 10/24/11 0644   10/23/11 0900   ceFAZolin (ANCEF) IVPB 2 g/50 mL premix        2 g 100 mL/hr over 30 Minutes Intravenous 60 min pre-op 10/23/11 0900 10/23/11 1210           Patient was given sequential compression devices, early ambulation to prevent DVT.  Patient benefited maximally from hospital stay and there were no complications.    Recent vital signs: Patient Vitals for the past 24 hrs:  BP Temp Temp src Pulse Resp SpO2  10/25/11 0533 123/76 mmHg 97.7 F (36.5 C) Oral 87  16  95 %  10/24/11 2135 126/74 mmHg 98.3 F (36.8 C) Oral 89   18  96 %  10/24/11 1538 156/76 mmHg 98.4 F (36.9 C) Oral 100  16  96 %     Recent laboratory studies:  Basename 10/24/11 0410  WBC 12.9*  HGB 11.5*  HCT 32.7*  PLT 224  NA 136  K 3.5  CL 100  CO2 24  BUN 10  CREATININE 0.80  GLUCOSE 133*  INR --  CALCIUM 8.3*     Discharge Medications:     Medication List     As of 10/25/2011  2:11 PM    STOP taking these medications         meloxicam 7.5 MG tablet   Commonly known as: MOBIC      TAKE these medications         aspirin EC 81 MG tablet   Take 81 mg by mouth every morning.      atorvastatin 40 MG tablet   Commonly known as: LIPITOR   Take 40 mg by mouth daily at 12 noon.      fish oil-omega-3 fatty acids 1000 MG capsule   Take 1 g by mouth daily.      lisinopril-hydrochlorothiazide 20-25 MG per tablet   Commonly known as: PRINZIDE,ZESTORETIC   Take 1 tablet by mouth every morning.      oxyCODONE-acetaminophen 5-325 MG per tablet   Commonly known as: PERCOCET/ROXICET  Take 1-2 tablets by mouth every 4 (four) hours as needed.        Diagnostic Studies: Dg Chest 2 View  10/21/2011  *RADIOLOGY REPORT*  Clinical Data: Preop shoulder surgery  CHEST - 2 VIEW  Comparison: None.  Findings: Heart size and vascularity are normal.  Lungs are clear without infiltrate or effusion.  No mass lesion.  Surgical screw in the left scapula is fractured.  There is degenerative change in both shoulder joints.  IMPRESSION: No active cardiopulmonary disease.   Original Report Authenticated By: Camelia Phenes, M.D.    Dg Shoulder 1v Right  10/23/2011  *RADIOLOGY REPORT*  Clinical Data: Evaluate right shoulder following arthroplasty.  RIGHT SHOULDER - 1 VIEW  Comparison: No priors.  Findings: Postoperative changes of right shoulder hemiarthroplasty are noted.  No definite periprosthetic fracture or other immediate complicating features are noted.  The shoulder appears located on this single-view examination.  IMPRESSION: 1.  Status  post right shoulder hemiarthroplasty without immediate complicating features, as above.   Original Report Authenticated By: Florencia Reasons, M.D.     Disposition:Home      Discharge Orders    Future Orders Please Complete By Expires   Diet general      Call MD / Call 911      Comments:   If you experience chest pain or shortness of breath, CALL 911 and be transported to the hospital emergency room.  If you develope a fever above 101 F, pus (white drainage) or increased drainage or redness at the wound, or calf pain, call your surgeon's office.   Increase activity slowly as tolerated      Discharge instructions      Comments:   Do exercises as shown by Dr Ave Filter.      Follow-up Information    Follow up with Mable Paris, MD. Schedule an appointment as soon as possible for a visit in 2 weeks.   Contact information:   [facility] [address] [place] [address] [phone]           Signed: Matthew Folks 10/25/2011, 2:11 PM

## 2011-10-25 NOTE — Progress Notes (Signed)
Pt stable, scripts, d/c instructions given with no questions/concerns voiced.  Pt transported via wheelchair to private vehicle with NT and friend.

## 2011-10-27 ENCOUNTER — Encounter (HOSPITAL_COMMUNITY): Payer: Self-pay | Admitting: Orthopedic Surgery

## 2011-10-27 NOTE — Progress Notes (Signed)
Discharge summary sent to payer through [employer]  

## 2012-06-28 ENCOUNTER — Other Ambulatory Visit: Payer: Self-pay | Admitting: Orthopedic Surgery

## 2012-07-13 ENCOUNTER — Encounter (HOSPITAL_COMMUNITY): Payer: Self-pay | Admitting: Pharmacy Technician

## 2012-07-15 NOTE — Patient Instructions (Addendum)
20 Teng Decou  07/15/2012   Your procedure is scheduled on: 07/22/12  Report to [facility] at 0515 AM.  Call this number if you have problems the morning of surgery 336-: [phone]   Remember:   Do not eat food or drink liquids After Midnight.     Take these medicines the morning of surgery with A SIP OF WATER: amlodipine, lipitor   Do not wear jewelry, make-up or nail polish.  Do not wear lotions, powders, or perfumes. You may wear deodorant.  Do not shave 48 hours prior to surgery. Men may shave face and neck.  Do not bring valuables to the hospital.  Contacts, dentures or bridgework may not be worn into surgery.  Leave suitcase in the car. After surgery it may be brought to your room.  For patients admitted to the hospital, checkout time is 11:00 AM the day of discharge.    Please read over the following fact sheets that you were given: MRSA Information, incentive spirometry fact sheet, blood fact sheet  Birdie Sons, RN  pre op nurse call if needed [phone]    FAILURE TO FOLLOW THESE INSTRUCTIONS MAY RESULT IN CANCELLATION OF YOUR SURGERY   Patient Signature: ___________________________________________

## 2012-07-16 ENCOUNTER — Encounter (HOSPITAL_COMMUNITY)
Admission: RE | Admit: 2012-07-16 | Discharge: 2012-07-16 | Disposition: A | Discharge status: Home / Self Care | Payer: Non-veteran care | Source: Ambulatory Visit | Attending: Orthopedic Surgery | Admitting: Orthopedic Surgery

## 2012-07-16 ENCOUNTER — Encounter (HOSPITAL_COMMUNITY): Payer: Self-pay

## 2012-07-16 ENCOUNTER — Ambulatory Visit (HOSPITAL_COMMUNITY)
Admission: RE | Admit: 2012-07-16 | Discharge: 2012-07-16 | Disposition: A | Discharge status: Home / Self Care | Payer: Non-veteran care | Source: Ambulatory Visit | Attending: Orthopedic Surgery | Admitting: Orthopedic Surgery

## 2012-07-16 DIAGNOSIS — Z01810 Encounter for preprocedural cardiovascular examination: Secondary | ICD-10-CM | POA: Insufficient documentation

## 2012-07-16 DIAGNOSIS — M19019 Primary osteoarthritis, unspecified shoulder: Secondary | ICD-10-CM | POA: Insufficient documentation

## 2012-07-16 DIAGNOSIS — Z01818 Encounter for other preprocedural examination: Secondary | ICD-10-CM | POA: Insufficient documentation

## 2012-07-16 HISTORY — DX: Gastro-esophageal reflux disease without esophagitis: K21.9

## 2012-07-16 HISTORY — DX: Occlusion and stenosis of unspecified cerebral artery: I66.9

## 2012-07-16 LAB — CBC WITH DIFFERENTIAL/PLATELET
Basophils Absolute: 0 10*3/uL (ref 0.0–0.1)
Eosinophils Relative: 1 % (ref 0–5)
Lymphocytes Relative: 31 % (ref 12–46)
Lymphs Abs: 1.9 10*3/uL (ref 0.7–4.0)
MCV: 84.2 fL (ref 78.0–100.0)
Neutro Abs: 3.7 10*3/uL (ref 1.7–7.7)
Neutrophils Relative %: 60 % (ref 43–77)
Platelets: 246 10*3/uL (ref 150–400)
RBC: 5.12 MIL/uL (ref 4.22–5.81)
RDW: 13.3 % (ref 11.5–15.5)
WBC: 6.2 10*3/uL (ref 4.0–10.5)

## 2012-07-16 LAB — COMPREHENSIVE METABOLIC PANEL
ALT: 39 U/L (ref 0–53)
AST: 33 U/L (ref 0–37)
Alkaline Phosphatase: 63 U/L (ref 39–117)
CO2: 30 mEq/L (ref 19–32)
Chloride: 99 mEq/L (ref 96–112)
GFR calc Af Amer: >90 mL/min (ref >90)
GFR calc non Af Amer: 89 mL/min — ABNORMAL LOW (ref >90)
Glucose, Bld: 114 mg/dL — ABNORMAL HIGH (ref 70–99)
Potassium: 3.3 mEq/L — ABNORMAL LOW (ref 3.5–5.1)
Sodium: 139 mEq/L (ref 135–145)
Total Bilirubin: 0.5 mg/dL (ref 0.3–1.2)

## 2012-07-16 LAB — URINALYSIS, ROUTINE W REFLEX MICROSCOPIC
Glucose, UA: NEGATIVE mg/dL
Hgb urine dipstick: NEGATIVE
Ketones, ur: NEGATIVE mg/dL
Leukocytes, UA: NEGATIVE
Protein, ur: NEGATIVE mg/dL
pH: 5 (ref 5.0–8.0)

## 2012-07-16 LAB — SURGICAL PCR SCREEN
MRSA, PCR: NEGATIVE
Staphylococcus aureus: POSITIVE — AB

## 2012-07-16 LAB — APTT: aPTT: 27 seconds (ref 24–37)

## 2012-07-22 ENCOUNTER — Encounter (HOSPITAL_COMMUNITY): Payer: Self-pay | Admitting: *Deleted

## 2012-07-22 ENCOUNTER — Inpatient Hospital Stay (HOSPITAL_COMMUNITY): Payer: Non-veteran care

## 2012-07-22 ENCOUNTER — Encounter (HOSPITAL_COMMUNITY)
Admission: RE | Disposition: A | Discharge status: Home / Self Care | Payer: Self-pay | Source: Ambulatory Visit | Attending: Orthopedic Surgery

## 2012-07-22 ENCOUNTER — Inpatient Hospital Stay (HOSPITAL_COMMUNITY)
Admission: RE | Admit: 2012-07-22 | Discharge: 2012-07-23 | DRG: 484 | Disposition: A | Discharge status: Home / Self Care | Payer: Non-veteran care | Source: Ambulatory Visit | Attending: Orthopedic Surgery | Admitting: Orthopedic Surgery

## 2012-07-22 ENCOUNTER — Inpatient Hospital Stay (HOSPITAL_COMMUNITY): Payer: Non-veteran care | Admitting: Anesthesiology

## 2012-07-22 ENCOUNTER — Encounter (HOSPITAL_COMMUNITY): Payer: Self-pay | Admitting: Anesthesiology

## 2012-07-22 DIAGNOSIS — M19012 Primary osteoarthritis, left shoulder: Secondary | ICD-10-CM

## 2012-07-22 DIAGNOSIS — M19019 Primary osteoarthritis, unspecified shoulder: Principal | ICD-10-CM | POA: Diagnosis present

## 2012-07-22 DIAGNOSIS — Z87891 Personal history of nicotine dependence: Secondary | ICD-10-CM

## 2012-07-22 DIAGNOSIS — Z96612 Presence of left artificial shoulder joint: Secondary | ICD-10-CM

## 2012-07-22 DIAGNOSIS — E785 Hyperlipidemia, unspecified: Secondary | ICD-10-CM | POA: Diagnosis present

## 2012-07-22 DIAGNOSIS — I10 Essential (primary) hypertension: Secondary | ICD-10-CM | POA: Diagnosis present

## 2012-07-22 HISTORY — PX: TOTAL SHOULDER ARTHROPLASTY: SHX126

## 2012-07-22 LAB — TYPE AND SCREEN

## 2012-07-22 LAB — GLUCOSE, CAPILLARY: Glucose-Capillary: 114 mg/dL — ABNORMAL HIGH (ref 70–99)

## 2012-07-22 SURGERY — ARTHROPLASTY, SHOULDER, TOTAL
Anesthesia: General | Site: Shoulder | Laterality: Left | Wound class: Clean

## 2012-07-22 MED ORDER — PHENYLEPHRINE HCL 10 MG/ML IJ SOLN
INTRAMUSCULAR | Status: DC | PRN
  Administered 2012-07-22 (×4): 80 ug via INTRAVENOUS
  Administered 2012-07-22: 40 ug via INTRAVENOUS
  Administered 2012-07-22 (×2): 80 ug via INTRAVENOUS

## 2012-07-22 MED ORDER — HYDROCHLOROTHIAZIDE 25 MG PO TABS
25.0000 mg | ORAL_TABLET | Freq: Every day | ORAL | Status: DC
  Administered 2012-07-23: 25 mg via ORAL
  Filled 2012-07-22: qty 1

## 2012-07-22 MED ORDER — METOCLOPRAMIDE HCL 5 MG/ML IJ SOLN
INTRAMUSCULAR | Status: DC | PRN
  Administered 2012-07-22: 10 mg via INTRAVENOUS

## 2012-07-22 MED ORDER — LISINOPRIL-HYDROCHLOROTHIAZIDE 20-25 MG PO TABS: 1.0000 | ORAL_TABLET | Freq: Every morning | ORAL | Status: DC

## 2012-07-22 MED ORDER — MORPHINE SULFATE 2 MG/ML IJ SOLN: 1.0000 mg | INTRAMUSCULAR | Status: DC | PRN

## 2012-07-22 MED ORDER — HYDROCODONE-ACETAMINOPHEN 5-325 MG PO TABS: 1.0000 | ORAL_TABLET | ORAL | Status: DC | PRN

## 2012-07-22 MED ORDER — OXYCODONE-ACETAMINOPHEN 5-325 MG PO TABS: 1.0000 | ORAL_TABLET | ORAL | Status: DC | PRN

## 2012-07-22 MED ORDER — OXYCODONE-ACETAMINOPHEN 5-325 MG PO TABS
1.0000 | ORAL_TABLET | ORAL | Status: DC | PRN
  Administered 2012-07-23 (×2): 2 via ORAL
  Filled 2012-07-22 (×2): qty 2

## 2012-07-22 MED ORDER — MENTHOL 3 MG MT LOZG: 1.0000 | LOZENGE | OROMUCOSAL | Status: DC | PRN

## 2012-07-22 MED ORDER — MIDAZOLAM HCL 5 MG/5ML IJ SOLN
INTRAMUSCULAR | Status: DC | PRN
  Administered 2012-07-22: 2 mg via INTRAVENOUS

## 2012-07-22 MED ORDER — ROPIVACAINE HCL 5 MG/ML IJ SOLN
INTRAMUSCULAR | Status: DC | PRN
  Administered 2012-07-22: 30 mL via EPIDURAL

## 2012-07-22 MED ORDER — PROMETHAZINE HCL 25 MG/ML IJ SOLN: 6.2500 mg | INTRAMUSCULAR | Status: DC | PRN

## 2012-07-22 MED ORDER — ALUMINUM HYDROXIDE GEL 320 MG/5ML PO SUSP
15.0000 mL | ORAL | Status: DC | PRN
  Filled 2012-07-22: qty 30

## 2012-07-22 MED ORDER — ONDANSETRON HCL 4 MG PO TABS: 4.0000 mg | ORAL_TABLET | Freq: Four times a day (QID) | ORAL | Status: DC | PRN

## 2012-07-22 MED ORDER — ATORVASTATIN CALCIUM 40 MG PO TABS
40.0000 mg | ORAL_TABLET | Freq: Every morning | ORAL | Status: DC
  Filled 2012-07-22: qty 1

## 2012-07-22 MED ORDER — PHENOL 1.4 % MT LIQD: 1.0000 | OROMUCOSAL | Status: DC | PRN

## 2012-07-22 MED ORDER — METOCLOPRAMIDE HCL 5 MG/ML IJ SOLN: 5.0000 mg | Freq: Three times a day (TID) | INTRAMUSCULAR | Status: DC | PRN

## 2012-07-22 MED ORDER — FENTANYL CITRATE 0.05 MG/ML IJ SOLN: 25.0000 ug | INTRAMUSCULAR | Status: DC | PRN

## 2012-07-22 MED ORDER — ACETAMINOPHEN 650 MG RE SUPP: 650.0000 mg | Freq: Four times a day (QID) | RECTAL | Status: DC | PRN

## 2012-07-22 MED ORDER — FLEET ENEMA 7-19 GM/118ML RE ENEM: 1.0000 | ENEMA | Freq: Once | RECTAL | Status: AC | PRN

## 2012-07-22 MED ORDER — METOCLOPRAMIDE HCL 5 MG PO TABS
5.0000 mg | ORAL_TABLET | Freq: Three times a day (TID) | ORAL | Status: DC | PRN
  Filled 2012-07-22: qty 2

## 2012-07-22 MED ORDER — OXYCODONE HCL 5 MG PO TABS
5.0000 mg | ORAL_TABLET | ORAL | Status: DC | PRN
  Administered 2012-07-22 – 2012-07-23 (×5): 10 mg via ORAL
  Filled 2012-07-22 (×5): qty 2

## 2012-07-22 MED ORDER — DOCUSATE SODIUM 100 MG PO CAPS
100.0000 mg | ORAL_CAPSULE | Freq: Two times a day (BID) | ORAL | Status: DC
  Administered 2012-07-22 – 2012-07-23 (×2): 100 mg via ORAL

## 2012-07-22 MED ORDER — LACTATED RINGERS IV SOLN
INTRAVENOUS | Status: DC | PRN
  Administered 2012-07-22 (×3): via INTRAVENOUS

## 2012-07-22 MED ORDER — POVIDONE-IODINE 7.5 % EX SOLN: Freq: Once | CUTANEOUS | Status: DC

## 2012-07-22 MED ORDER — ONDANSETRON HCL 4 MG/2ML IJ SOLN
INTRAMUSCULAR | Status: DC | PRN
  Administered 2012-07-22: 4 mg via INTRAVENOUS

## 2012-07-22 MED ORDER — 0.9 % SODIUM CHLORIDE (POUR BTL) OPTIME
TOPICAL | Status: DC | PRN
  Administered 2012-07-22: 1000 mL

## 2012-07-22 MED ORDER — DIPHENHYDRAMINE HCL 12.5 MG/5ML PO ELIX: 12.5000 mg | ORAL_SOLUTION | ORAL | Status: DC | PRN

## 2012-07-22 MED ORDER — ACETAMINOPHEN 325 MG PO TABS: 650.0000 mg | ORAL_TABLET | Freq: Four times a day (QID) | ORAL | Status: DC | PRN

## 2012-07-22 MED ORDER — PROPOFOL 10 MG/ML IV BOLUS
INTRAVENOUS | Status: DC | PRN
  Administered 2012-07-22: 200 mg via INTRAVENOUS

## 2012-07-22 MED ORDER — ASPIRIN EC 325 MG PO TBEC
325.0000 mg | DELAYED_RELEASE_TABLET | Freq: Two times a day (BID) | ORAL | Status: DC
  Administered 2012-07-22 – 2012-07-23 (×2): 325 mg via ORAL
  Filled 2012-07-22 (×4): qty 1

## 2012-07-22 MED ORDER — MEPERIDINE HCL 50 MG/ML IJ SOLN: 6.2500 mg | INTRAMUSCULAR | Status: DC | PRN

## 2012-07-22 MED ORDER — ONDANSETRON HCL 4 MG/2ML IJ SOLN: 4.0000 mg | Freq: Four times a day (QID) | INTRAMUSCULAR | Status: DC | PRN

## 2012-07-22 MED ORDER — ROCURONIUM BROMIDE 100 MG/10ML IV SOLN
INTRAVENOUS | Status: DC | PRN
  Administered 2012-07-22: 50 mg via INTRAVENOUS
  Administered 2012-07-22: 10 mg via INTRAVENOUS

## 2012-07-22 MED ORDER — SODIUM CHLORIDE 0.9 % IV SOLN
INTRAVENOUS | Status: DC
  Administered 2012-07-22: 20 mL/h via INTRAVENOUS
  Administered 2012-07-22: 13:00:00 via INTRAVENOUS

## 2012-07-22 MED ORDER — CEFAZOLIN SODIUM-DEXTROSE 2-3 GM-% IV SOLR
2.0000 g | INTRAVENOUS | Status: AC
  Administered 2012-07-22: 2 g via INTRAVENOUS

## 2012-07-22 MED ORDER — AMLODIPINE BESYLATE 10 MG PO TABS
10.0000 mg | ORAL_TABLET | Freq: Every morning | ORAL | Status: DC
  Administered 2012-07-23: 10 mg via ORAL
  Filled 2012-07-22: qty 1

## 2012-07-22 MED ORDER — ZOLPIDEM TARTRATE 5 MG PO TABS: 5.0000 mg | ORAL_TABLET | Freq: Every evening | ORAL | Status: DC | PRN

## 2012-07-22 MED ORDER — BISACODYL 5 MG PO TBEC: 5.0000 mg | DELAYED_RELEASE_TABLET | Freq: Every day | ORAL | Status: DC | PRN

## 2012-07-22 MED ORDER — GLYCOPYRROLATE 0.2 MG/ML IJ SOLN
INTRAMUSCULAR | Status: DC | PRN
  Administered 2012-07-22: 0.6 mg via INTRAVENOUS

## 2012-07-22 MED ORDER — LACTATED RINGERS IV SOLN: INTRAVENOUS | Status: DC

## 2012-07-22 MED ORDER — KETAMINE HCL 10 MG/ML IJ SOLN
INTRAMUSCULAR | Status: DC | PRN
  Administered 2012-07-22: 15 mg via INTRAVENOUS

## 2012-07-22 MED ORDER — CEFAZOLIN SODIUM-DEXTROSE 2-3 GM-% IV SOLR
2.0000 g | Freq: Four times a day (QID) | INTRAVENOUS | Status: AC
  Administered 2012-07-22 – 2012-07-23 (×3): 2 g via INTRAVENOUS
  Filled 2012-07-22 (×3): qty 50

## 2012-07-22 MED ORDER — HYDROMORPHONE HCL PF 1 MG/ML IJ SOLN
INTRAMUSCULAR | Status: DC | PRN
  Administered 2012-07-22 (×2): 1 mg via INTRAVENOUS

## 2012-07-22 MED ORDER — LISINOPRIL 20 MG PO TABS
20.0000 mg | ORAL_TABLET | Freq: Every day | ORAL | Status: DC
  Administered 2012-07-23: 20 mg via ORAL
  Filled 2012-07-22: qty 1

## 2012-07-22 MED ORDER — SODIUM CHLORIDE 0.9 % IR SOLN
Status: DC | PRN
  Administered 2012-07-22: 1000 mL

## 2012-07-22 MED ORDER — NEOSTIGMINE METHYLSULFATE 1 MG/ML IJ SOLN
INTRAMUSCULAR | Status: DC | PRN
  Administered 2012-07-22: 5 mg via INTRAVENOUS

## 2012-07-22 MED ORDER — PHENYLEPHRINE HCL 10 MG/ML IJ SOLN
10.0000 mg | INTRAMUSCULAR | Status: DC | PRN
  Administered 2012-07-22: 30 ug/min via INTRAVENOUS

## 2012-07-22 MED ORDER — POLYETHYLENE GLYCOL 3350 17 G PO PACK: 17.0000 g | PACK | Freq: Every day | ORAL | Status: DC | PRN

## 2012-07-22 MED ORDER — FENTANYL CITRATE 0.05 MG/ML IJ SOLN
INTRAMUSCULAR | Status: DC | PRN
  Administered 2012-07-22: 50 ug via INTRAVENOUS
  Administered 2012-07-22 (×3): 25 ug via INTRAVENOUS
  Administered 2012-07-22: 50 ug via INTRAVENOUS

## 2012-07-22 SURGICAL SUPPLY — 71 items
ASSEMBLY NECK TAPER FIXED 135 (Orthopedic Implant) ×2 IMPLANT
BAG ZIPLOCK 12X15 (MISCELLANEOUS) ×2 IMPLANT
BIT DRILL 1.6MX128 (BIT) IMPLANT
BIT DRILL 2.4X128 (BIT) ×2 IMPLANT
BLADE SAW SAG 73X25 THK (BLADE) ×1
BLADE SAW SGTL 18X1.27X75 (BLADE) ×2 IMPLANT
BLADE SAW SGTL 73X25 THK (BLADE) ×1 IMPLANT
BLADE SURG 15 STRL LF DISP TIS (BLADE) ×1 IMPLANT
BLADE SURG 15 STRL SS (BLADE) ×1
CEMENT BONE DEPUY (Cement) ×2 IMPLANT
CHLORAPREP W/TINT 26ML (MISCELLANEOUS) ×2 IMPLANT
CLEANER TIP ELECTROSURG 2X2 (MISCELLANEOUS) ×2 IMPLANT
CLOTH BEACON ORANGE TIMEOUT ST (SAFETY) ×2 IMPLANT
DRAPE INCISE IOBAN 66X45 STRL (DRAPES) ×2 IMPLANT
DRAPE ORTHO SPLIT 77X108 STRL (DRAPES) ×1
DRAPE POUCH INSTRU U-SHP 10X18 (DRAPES) ×2 IMPLANT
DRAPE SURG 17X11 SM STRL (DRAPES) ×2 IMPLANT
DRAPE SURG ORHT 6 SPLT 77X108 (DRAPES) ×1 IMPLANT
DRAPE TABLE BACK 44X90 PK DISP (DRAPES) ×2 IMPLANT
DRAPE U-SHAPE 47X51 STRL (DRAPES) ×2 IMPLANT
DRSG ADAPTIC 3X8 NADH LF (GAUZE/BANDAGES/DRESSINGS) ×2 IMPLANT
DRSG MEPILEX BORDER 4X4 (GAUZE/BANDAGES/DRESSINGS) ×2 IMPLANT
DRSG MEPILEX BORDER 4X8 (GAUZE/BANDAGES/DRESSINGS) IMPLANT
DRSG PAD ABDOMINAL 8X10 ST (GAUZE/BANDAGES/DRESSINGS) ×4 IMPLANT
ELECT BLADE TIP CTD 4 INCH (ELECTRODE) ×2 IMPLANT
ELECT REM PT RETURN 9FT ADLT (ELECTROSURGICAL) ×2
ELECTRODE REM PT RTRN 9FT ADLT (ELECTROSURGICAL) ×1 IMPLANT
EVACUATOR 1/8 PVC DRAIN (DRAIN) ×2 IMPLANT
FACESHIELD LNG OPTICON STERILE (SAFETY) ×4 IMPLANT
GLENOID ANCHOR PEG CROSSLK 48 (Orthopedic Implant) ×2 IMPLANT
GLOVE BIOGEL PI IND STRL 7.0 (GLOVE) ×1 IMPLANT
GLOVE BIOGEL PI IND STRL 8 (GLOVE) ×1 IMPLANT
GLOVE BIOGEL PI INDICATOR 7.0 (GLOVE) ×1
GLOVE BIOGEL PI INDICATOR 8 (GLOVE) ×1
GLOVE ORTHO TXT STRL SZ7.5 (GLOVE) ×2 IMPLANT
GLOVE SURG ORTHO 7.0 STRL STRW (GLOVE) ×2 IMPLANT
GOWN STRL NON-REIN LRG LVL3 (GOWN DISPOSABLE) ×2 IMPLANT
GOWN STRL REIN XL XLG (GOWN DISPOSABLE) ×4 IMPLANT
HANDPIECE INTERPULSE COAX TIP (DISPOSABLE) ×1
HEAD HUMERAL GLOBAL STD 48X21 (Head) ×2 IMPLANT
HEMOSTAT SURGICEL 4X8 (HEMOSTASIS) ×2 IMPLANT
HOOD PEEL AWAY FACE SHEILD DIS (HOOD) ×4 IMPLANT
KIT BASIN OR (CUSTOM PROCEDURE TRAY) ×2 IMPLANT
MANIFOLD NEPTUNE II (INSTRUMENTS) ×2 IMPLANT
NEEDLE MA TROC 1/2 (NEEDLE) ×2 IMPLANT
NOZZLE PRISM 8.5MM (MISCELLANEOUS) ×2 IMPLANT
NS IRRIG 1000ML POUR BTL (IV SOLUTION) ×2 IMPLANT
PACK SHOULDER CUSTOM OPM052 (CUSTOM PROCEDURE TRAY) ×2 IMPLANT
POSITIONER SURGICAL ARM (MISCELLANEOUS) ×2 IMPLANT
RETRIEVER SUT HEWSON (MISCELLANEOUS) ×2 IMPLANT
SET HNDPC FAN SPRY TIP SCT (DISPOSABLE) ×1 IMPLANT
SLING ARM IMMOBILIZER LRG (SOFTGOODS) ×2 IMPLANT
SMARTMIX MINI TOWER (MISCELLANEOUS) ×2
SPONGE GAUZE 4X4 12PLY (GAUZE/BANDAGES/DRESSINGS) ×2 IMPLANT
SPONGE LAP 18X18 X RAY DECT (DISPOSABLE) ×6 IMPLANT
STAPLER VISISTAT 35W (STAPLE) IMPLANT
STEM GLOBAL AP 12MM (Stem) ×2 IMPLANT
STRIP CLOSURE SKIN 1/2X4 (GAUZE/BANDAGES/DRESSINGS) ×4 IMPLANT
SUCTION FRAZIER 12FR DISP (SUCTIONS) ×2 IMPLANT
SUPPORT WRAP ARM LG (MISCELLANEOUS) ×2 IMPLANT
SUT ETHIBOND NAB CT1 #1 30IN (SUTURE) ×4 IMPLANT
SUT FIBERWIRE #2 38 T-5 BLUE (SUTURE) ×6
SUT FIBERWIRE #5 38 BLUE (WIRE) ×2 IMPLANT
SUT MNCRL AB 4-0 PS2 18 (SUTURE) ×2 IMPLANT
SUT VIC AB 0 CT1 36 (SUTURE) ×2 IMPLANT
SUT VIC AB 2-0 CT1 27 (SUTURE) ×2
SUT VIC AB 2-0 CT1 TAPERPNT 27 (SUTURE) ×2 IMPLANT
SUTURE FIBERWR #2 38 T-5 BLUE (SUTURE) ×3 IMPLANT
TOWEL OR 17X26 10 PK STRL BLUE (TOWEL DISPOSABLE) ×4 IMPLANT
TOWER SMARTMIX MINI (MISCELLANEOUS) ×1 IMPLANT
WATER STERILE IRR 1500ML POUR (IV SOLUTION) ×2 IMPLANT

## 2012-07-22 NOTE — Anesthesia Postprocedure Evaluation (Signed)
  Anesthesia Post-op Note  Patient: Federico Maiorino  Procedure(s) Performed: Procedure(s) (LRB): LEFT TOTAL SHOULDER ARTHROPLASTY (Left)  Patient Location: PACU  Anesthesia Type: GA combined with regional for post-op pain  Level of Consciousness: awake and alert   Airway and Oxygen Therapy: Patient Spontanous Breathing  Post-op Pain: mild  Post-op Assessment: Post-op Vital signs reviewed, Patient's Cardiovascular Status Stable, Respiratory Function Stable, Patent Airway and No signs of Nausea or vomiting  Last Vitals:  Filed Vitals:   07/22/12 1445  BP: 126/77  Pulse: 92  Temp: 36.6 C  Resp: 12    Post-op Vital Signs: stable   Complications: No apparent anesthesia complications

## 2012-07-22 NOTE — Anesthesia Procedure Notes (Signed)
Anesthesia Regional Block:  Supraclavicular block  Pre-Anesthetic Checklist: ,, timeout performed, Correct Patient, Correct Site, Correct Laterality, Correct Procedure, Correct Position, site marked, Risks and benefits discussed,  Surgical consent,  Pre-op evaluation,  At surgeon's request and post-op pain management  Laterality: Left and Upper  Prep: chloraprep       Needles:  Injection technique: Single-shot  Needle Type: Stimiplex     Needle Length: 10cm 10 cm Needle Gauge: 21 G    Additional Needles:  Procedures: ultrasound guided (picture in chart) and nerve stimulator Supraclavicular block Narrative:  Injection made incrementally with aspirations every 5 mL.  Performed by: Personally  Anesthesiologist: Phillips Grout MD  Additional Notes: Risks, benefits and alternative to block explained extensively.  Patient tolerated procedure well, without complications.  Supraclavicular block

## 2012-07-22 NOTE — Transfer of Care (Signed)
Immediate Anesthesia Transfer of Care Note  Patient: [name] [name]  Procedure(s) Performed: Procedure(s): LEFT TOTAL SHOULDER ARTHROPLASTY (Left)  Patient Location: PACU  Anesthesia Type:General  Level of Consciousness: awake, alert , oriented, patient cooperative and responds to stimulation  Airway & Oxygen Therapy: Patient Spontanous Breathing and Patient connected to face mask  Post-op Assessment: Report given to PACU RN and Post -op Vital signs reviewed and stable  Post vital signs: Reviewed and stable  Complications: No apparent anesthesia complications

## 2012-07-22 NOTE — Op Note (Signed)
Procedure(s): LEFT TOTAL SHOULDER ARTHROPLASTY Procedure Note  [name] [name] male 57 y.o. 07/22/2012  Procedure(s) and Anesthesia Type:    LEFT TOTAL SHOULDER ARTHROPLASTY - General  Surgeon(s) and Role:    * Mable Paris, MD - Primary   Indications:  57 y.o. 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 Pain and dysfunction interfered with quality of life and nonoperative treatment with activity modification, NSAIDS and injections failed.     Surgeon: Mable Paris   Assistants: Damita Lack PA-C [facility] was present and scrubbed throughout the procedure and was essential in positioning, retraction, exposure, and closure)  Anesthesia: General endotracheal anesthesia with preoperative interscalene block    Procedure Detail  LEFT TOTAL SHOULDER ARTHROPLASTY  Findings: He had severe glenohumeral arthritis with a very large inferior humeral head osteophyte and complete flattening of the humeral head with complete loss of cartilage. There is posterior wear of the glenoid with posterior subluxation. Total shoulder replacement was performed with DePuy size 12 porous coated stem and a 48 x 21 head centered. An anchor peg cemented glenoid size 48 was used. The anterior inferior peg had to be cut to about half the normal size do to the location of the previously placed screw. The anterior glenoid was reamed down approximately 3 mm to correct some of the retroversion. There was a tendency towards posterior instability with a 48 x 18 head. The 48 x 21 head did improve this. It was felt that with dynamic muscular contracture and repair of the subscapularis anteriorly the tendency towards posterior subluxation would be corrected.  Estimated Blood Loss:  300 mL         Drains: 1 medium hemovac  Blood Given: none          Specimens: none        Complications:  * No complications entered in OR log *          Disposition: PACU - hemodynamically stable.         Condition: stable    Procedure:   The patient was identified in the preoperative holding area where I personally marked the operative extremity after verifying with the patient and consent. He  was taken to the operating room where He was transferred to the   operative table.  The patient received an interscalene block in   the holding area by the attending anesthesiologist.  General anesthesia was induced   in the operating room without complication.  The patient did receive IV  Ancef prior to the commencement of the procedure.  The patient was   placed in the beach-chair position with the back raised about 30   degrees.  The nonoperative extremity and head and neck were carefully   positioned and padded protecting against neurovascular compromise.  The   left upper extremity was then prepped and draped in the standard sterile   fashion.    The appropriate operative time-out was performed with   Anesthesia, the perioperative staff, as well as myself and we all agreed   that the left side was the correct operative site.  An approximately   15 cm incision was made from the  clavicleto the center point of the   humerus at the level of the axilla. This incision was lateral to his previous incision.  Dissection was carried down sharply   through subcutaneous tissues and cephalic vein was identified and taken   laterally with the deltoid.  The pectoralis major  was taken medially.  The   upper 1 cm of the pectoralis major was released from its attachment on   the humerus.  The anatomy of the conjoined tendon and coracoid was abnormal given his prior surgery. I was able to get under the conjoined tendon distally and did verify the integrity of the axillary nerve by digital palpation.  Musculocutaneous nerve was not palpated in the operative   field.  Conjoined tendon was then retracted gently medially and the   deltoid laterally.  Anterior  circumflex humeral vessels were clamped and   coagulated.  He had an extremely large inferior humeral head osteophyte. The soft tissues overlying the biceps was incised and this   incision was carried across the transverse humeral ligament to the base   of the coracoid.  The biceps was tenodesed to the soft tissue just above   pectoralis major and the remaining portion of the biceps superiorly was   excised.  The subscapularis was taken off as one sleeve with the capsule and was was then   released all the way down to the 6 o'clock position of the humeral head. Great care was taken to stay on bone all the way around the very large inferior humeral head osteophyte. A half-inch curved osteotome was used to remove the inferior humeral head osteophyte at this point.  The humeral head was then delivered with simultaneous adduction,   extension and external rotation.  All humeral osteophytes were removed   and the anatomic neck of the humerus was marked and cut free hand at   approximately 20 degrees retroversion within about 3 mm of the cuff   reflection posteriorly.  The head size was estimated to be a 48 medium   offset.  At that point, the humeral head was retracted posteriorly with   a Fukuda retractor and the anterior-inferior capsule was excised.   Remaining portion of the capsule was released at the base of the   coracoid.  The remaining biceps anchor and the entire anterior-inferior   labrum was excised.  The posterior labrum was noted to be completely absent. The guidepin was placed bicortically with +3 elevated guide.  The reamer was used to ream to concentric bone with punctate bleeding.  This gave an excellent concentric surface.  And ream down the anterior glenoid correcting some of the retroversion. The center hole was then drilled for an anchor peg glenoid followed by the three peripheral holes and none of the holes   exited the glenoid wall.  The anterior-inferior hole was only able to be  drilled about halfway to to the screw and the subchondral bone. I then pulse irrigated these holes and dried   them with Surgicel.  The three peripheral holes were then   pressurized cemented and the anchor peg glenoid was placed and impacted   with an excellent fit. The anterior-inferior peg was cut in half with rongeur prior to implantation to ensure that it would sit flush over top of the screw. The glenoid was a 48 component.  The proximal humerus was then again exposed taking care not to displace the glenoid.    The humerus was then sequentially reamed going from 6 to 12 by 2 mm incriments. The 12 mm reamer was found to have appropriate cortical contact.  A   box osteotome was then used and a 12-mm broach.  The broach handle was   removed and the trial head was placed.   Calcar reamer was used.The eccentric  48 x 18 head fit best.  With the trial implantation of the component, there was a tendency towards posterior instability. Therefore the 48 x 21 head was trialed. This definitely improved the posterior instability. Internal rotation there was still a tendency towards posterior instability with slight external rotation there was bounce back to the anatomic position. It was felt that at this point with the anterior soft tissue closure this would provide stability.  The trial was removed and the final implant was prepared on a back table with a 48 x 21 head.   4 Small holes were drilled on the medial aspect of the lesser tuberosity, through which 3 #2 FiberWires were passed. The implant was then placed through the loop of all 3 #2 FiberWires and impacted with an excellent press-fit.  The joint was then copiously irrigated with pulse lavage.  The subscapularis was then repaired using the 3 #2 fiber wires previously passed, using 3 horizontal mattress sutures.   Two #1 Ethibonds were placed at the rotator interval just above   the lesser tuberosity, the tissue quality was noted to be poor. A medium    Hemovac was placed out anterolaterally and again copious irrigation was   used.   Skin was closed with 2-0 Vicryl sutures in the deep dermal layer and 4-0 Monocryl in a subcuticular  running fashion.  Sterile dressings were then applied including Steri- Strips, 4x4s, ABDs and tape.  The patient was placed in a sling and allowed to awaken from general anesthesia and taken to the recovery room in stable  condition.      POSTOPERATIVE PLAN:  Given the poor anterior tissue quality and his tendency towards posterior subluxation we're going to hold off on passive motion for the first 2 weeks and will just start with pendulums and hand wrist and elbow motion. When he comes back to his first postoperative visit 2 weeks we will start him with 90 in neutral and then advance to full motion at 6 weeks. The patient will likely be kept in the hospital for 1-2 days and then discharged home.

## 2012-07-22 NOTE — Anesthesia Preprocedure Evaluation (Addendum)
Anesthesia Evaluation  Patient identified by MRN, date of birth, ID band Patient awake    Reviewed: Allergy & Precautions, H&P , NPO status , Patient's Chart, lab work & pertinent test results  Airway Mallampati: II TM Distance: >3 FB Neck ROM: Full    Dental  (+) Teeth Intact and Dental Advisory Given,    Pulmonary neg pulmonary ROS,  breath sounds clear to auscultation  Pulmonary exam normal       Cardiovascular hypertension, Pt. on medications Rhythm:Regular Rate:Normal     Neuro/Psych Seizures -,  negative psych ROS   GI/Hepatic negative GI ROS, Neg liver ROS,   Endo/Other  diabetes (diet control)  Renal/GU negative Renal ROS  negative genitourinary   Musculoskeletal negative musculoskeletal ROS (+)   Abdominal   Peds negative pediatric ROS (+)  Hematology negative hematology ROS (+)   Anesthesia Other Findings   Reproductive/Obstetrics negative OB ROS                           Anesthesia Physical  Anesthesia Plan  ASA: I  Anesthesia Plan: General   Post-op Pain Management:    Induction: Intravenous  Airway Management Planned: Oral ETT  Additional Equipment:   Intra-op Plan:   Post-operative Plan: Extubation in OR  Informed Consent: I have reviewed the patients History and Physical, chart, labs and discussed the procedure including the risks, benefits and alternatives for the proposed anesthesia with the patient or authorized representative who has indicated his/her understanding and acceptance.     Plan Discussed with: CRNA  Anesthesia Plan Comments: (SCB for post op pain)        Anesthesia Quick Evaluation

## 2012-07-22 NOTE — H&P (Signed)
[name] [name] is an 57 y.o. male.   Chief Complaint: L shoulder pain and dysfunction HPI: s/p prior stabilization surgery L shoulder with severe advance OA.  Severe loss of function and pain limiting daily activities and sleep.  Failed conservative treatment.  Past Medical History  Diagnosis Date  . Hypertension   . Hyperlipidemia   . Arthritis   . Psoriasis   . History of kidney stones as child  . History of blood transfusion   . GERD (gastroesophageal reflux disease)     hx of, lost weight and it quit  . 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 Seizures last one Dec 2013    "BLACKOUTS" STARTED AT AGE 74 - NO EVALUATIONS HAVE EVER BEEN DONE  LAST "BLACKOUT" WAS IN 2007 HAS HAD SURGERY ON L SHOULDER SINCE WITH NO PROBLEMS     Past Surgical History  Procedure Laterality Date  . 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 Head injury  AGE 30    REMOVAL OF BLOOD CLOT   . Fx tibia  AGE 32    REPAIR OF FRACTURE  . Shoulder arthrotomy      REPAIR OF DISLOCATION  . Total shoulder arthroplasty  10/23/2011    Procedure: TOTAL SHOULDER ARTHROPLASTY;  Surgeon: Mable Paris, MD;  Location: [facility];  Service: Orthopedics;  Laterality: Right;    History reviewed. No pertinent family history. Social History:  reports that he quit smoking about 25 years ago. His smoking use included Cigarettes. He smoked 0.00 packs per day. He has never used smokeless tobacco. He reports that he does not drink alcohol or use illicit drugs.  Allergies: No Known Allergies  Medications Prior to Admission  Medication Sig Dispense Refill  . amLODipine (NORVASC) 10 MG tablet Take 10 mg by mouth every morning.      Marland Kitchen atorvastatin (LIPITOR) 40 MG tablet Take 40 mg by mouth every morning.       . Glucosamine-Fish Oil-EPA-DHA 500-400-60-40 MG CAPS Take 1 capsule by mouth daily.      Marland Kitchen lisinopril-hydrochlorothiazide (PRINZIDE,ZESTORETIC) 20-25 MG per tablet Take 1  tablet by mouth every morning.      . meloxicam (MOBIC) 7.5 MG tablet Take 7.5 mg by mouth daily.      Marland Kitchen aspirin EC 81 MG tablet Take 81 mg by mouth every morning.        No results found for this or any previous visit (from the past 48 hour(s)). No results found.  Review of Systems  All other systems reviewed and are negative.    Blood pressure 156/93, pulse 82, temperature 97.8 F (36.6 C), temperature source Oral, resp. rate 18, SpO2 99.00%. Physical Exam  Constitutional: He is oriented to person, place, and time. He appears well-developed and well-nourished.  HENT:  Head: Atraumatic.  Eyes: EOM are normal.  Cardiovascular: Intact distal pulses.   Respiratory: Effort normal.  Musculoskeletal:       Left shoulder: He exhibits decreased range of motion, tenderness and pain. He exhibits normal pulse.  Neurological: He is alert and oriented to person, place, and time.  Skin: Skin is warm and dry.  Psychiatric: He has a normal mood and affect.     Assessment/Plan L severe bone on bone shoulder OA, failed conservative treatment Plan L total shoulder  Risks / benefits of surgery discussed Consent on chart  NPO for OR Preop antibiotics   Susie Pousson  WILLIAM 07/22/2012, 7:14 AM

## 2012-07-22 NOTE — Preoperative (Signed)
Beta Blockers   Reason not to administer Beta Blockers:Not Applicable, not on home BB 

## 2012-07-23 LAB — BASIC METABOLIC PANEL
Calcium: 8.7 mg/dL (ref 8.4–10.5)
Creatinine, Ser: 1.07 mg/dL (ref 0.50–1.35)
GFR calc Af Amer: 86 mL/min — ABNORMAL LOW (ref >90)
GFR calc non Af Amer: 74 mL/min — ABNORMAL LOW (ref >90)

## 2012-07-23 LAB — CBC
MCH: 28.5 pg (ref 26.0–34.0)
MCHC: 33.3 g/dL (ref 30.0–36.0)
MCV: 85.4 fL (ref 78.0–100.0)
Platelets: 215 10*3/uL (ref 150–400)
RDW: 13.4 % (ref 11.5–15.5)
WBC: 10.8 10*3/uL — ABNORMAL HIGH (ref 4.0–10.5)

## 2012-07-23 NOTE — Progress Notes (Signed)
PATIENT ID: [name] [name]   1 Day Post-Op Procedure(s) (LRB): LEFT TOTAL SHOULDER ARTHROPLASTY (Left)  Subjective: Doing well.  Pain when block wore off, but now well controlled.  Objective:  Filed Vitals:   07/23/12 0604  BP: 147/90  Pulse: 107  Temp: 98.4 F (36.9 C)  Resp: 18     Dressing c/d/i.  Distally NVI. Drain d/c'd.  Labs:   Recent Labs  07/23/12 0537  HGB 11.1*   Recent Labs  07/23/12 0537  WBC 10.8*  RBC 3.90*  HCT 33.3*  PLT 215   Recent Labs  07/23/12 0537  NA 136  K 3.5  CL 99  CO2 34*  BUN 9  CREATININE 1.07  GLUCOSE 126*  CALCIUM 8.7    Assessment and Plan:Doing well, will d/c home today F/u 2 wks.  Hand/wrist /elbow and pendulums only until f/u.  VTE proph: ECASA/SCDs

## 2012-07-23 NOTE — Discharge Summary (Signed)
Patient ID: [name] [name] MRN: [id number] DOB/AGE: [birth year] 57 y.o.  Admit date: 07/22/2012 Discharge date: 07/23/2012  Admission Diagnoses:  Principal Problem:   Osteoarthritis of left shoulder   Discharge Diagnoses:  Same  Past Medical History  Diagnosis Date  . Hypertension   . Hyperlipidemia   . Arthritis   . Psoriasis   . History of kidney stones as child  . History of blood transfusion   . GERD (gastroesophageal reflux disease)     hx of, lost weight and it quit  . 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 Seizures last one Dec 2013    "BLACKOUTS" STARTED AT AGE 1 - NO EVALUATIONS HAVE EVER BEEN DONE  LAST "BLACKOUT" WAS IN 2007 HAS HAD SURGERY ON L SHOULDER SINCE WITH NO PROBLEMS     Surgeries: Procedure(s): LEFT TOTAL SHOULDER ARTHROPLASTY on 07/22/2012   Consultants:    Discharged Condition: Improved  Hospital Course: [name] [name] is an 57 y.o. male who was admitted 07/22/2012 for operative treatment ofOsteoarthritis of left shoulder. Patient has severe unremitting pain that affects sleep, daily activities, and work/hobbies. After pre-op clearance the patient was taken to the operating room on 07/22/2012 and underwent  Procedure(s): LEFT TOTAL SHOULDER ARTHROPLASTY.    Patient was given perioperative antibiotics: Anti-infectives   Start     Dose/Rate Route Frequency Ordered Stop   07/22/12 1400  ceFAZolin (ANCEF) IVPB 2 g/50 mL premix     2 g 100 mL/hr over 30 Minutes Intravenous Every 6 hours 07/22/12 1128 07/23/12 0221   07/22/12 0502  ceFAZolin (ANCEF) IVPB 2 g/50 mL premix     2 g 100 mL/hr over 30 Minutes Intravenous On call to O.R. 07/22/12 0502 07/22/12 0741       Patient was given sequential compression devices, early ambulation, and chemoprophylaxis to prevent DVT.  Patient benefited maximally from hospital stay and there were no complications.    Recent vital signs: Patient Vitals for the past 24  hrs:  BP Temp Temp src Pulse Resp SpO2 Height Weight  07/23/12 0604 147/90 mmHg 98.4 F (36.9 C) - 107 18 99 % - -  07/23/12 0200 137/80 mmHg 98.8 F (37.1 C) - 107 16 97 % - -  07/22/12 2122 124/73 mmHg 98.4 F (36.9 C) Oral 89 16 99 % - -  07/22/12 1534 127/79 mmHg 97.6 F (36.4 C) Oral 88 16 98 % - -  07/22/12 1445 126/77 mmHg 97.8 F (36.6 C) Oral 92 12 97 % - -  07/22/12 1332 133/84 mmHg 97.3 F (36.3 C) Oral 87 16 96 % - -  07/22/12 1228 131/80 mmHg 98 F (36.7 C) Oral 91 16 96 % - -  07/22/12 1147 - - - - - - 5\' 9"  (1.753 m) 99.2 kg (218 lb 11.1 oz)  07/22/12 1130 154/95 mmHg 97.7 F (36.5 C) - 93 12 96 % - -  07/22/12 1115 141/74 mmHg 98.2 F (36.8 C) - 84 3 99 % - -  07/22/12 1100 152/86 mmHg - - 87 11 100 % - -  07/22/12 1045 150/99 mmHg - - 87 9 100 % - -  07/22/12 1037 - 97.6 F (36.4 C) - - - - - -     Recent laboratory studies:  Recent Labs  07/23/12 0537  WBC 10.8*  HGB 11.1*  HCT 33.3*  PLT 215  NA 136  K 3.5  CL 99  CO2  34*  BUN 9  CREATININE 1.07  GLUCOSE 126*  CALCIUM 8.7     Discharge Medications:     Medication List    STOP taking these medications       meloxicam 7.5 MG tablet  Commonly known as:  MOBIC      TAKE these medications       amLODipine 10 MG tablet  Commonly known as:  NORVASC  Take 10 mg by mouth every morning.     aspirin EC 81 MG tablet  Take 81 mg by mouth every morning.     atorvastatin 40 MG tablet  Commonly known as:  LIPITOR  Take 40 mg by mouth every morning.     Glucosamine-Fish Oil-EPA-DHA 500-400-60-40 MG Caps  Take 1 capsule by mouth daily.     lisinopril-hydrochlorothiazide 20-25 MG per tablet  Commonly known as:  PRINZIDE,ZESTORETIC  Take 1 tablet by mouth every morning.     oxyCODONE-acetaminophen 5-325 MG per tablet  Commonly known as:  ROXICET  Take 1-2 tablets by mouth every 4 (four) hours as needed for pain.        Diagnostic Studies: Dg Chest 2 View  07/16/2012   *RADIOLOGY  REPORT*  Clinical Data: Preoperative chest x-ray prior to left shoulder replacement  CHEST - 2 VIEW  Comparison: Prior chest x-ray 10/21/2011  Findings: Mild pulmonary hyperexpansion with central bronchitic changes of prominent interstitial markings is similar to prior and suggests underlying COPD.  No pulmonary edema, focal airspace consolidation or suspicious pulmonary nodule.  Stable normal cardiac size.  Interval surgical changes of right shoulder arthroplasty.  Persistent degenerative secondary arthritis of the left glenohumeral joint.  Surgical screws and residual deformity in the region of the left glenoid neck suggest ORIF of a remote prior fracture.  IMPRESSION: No acute cardiopulmonary disease   Original Report Authenticated By: Malachy Moan, M.D.   Dg Shoulder Left Port  07/22/2012   *RADIOLOGY REPORT*  Clinical Data: Post left shoulder arthroplasty.  PORTABLE LEFT SHOULDER - 2+ VIEW  Comparison: 07/16/2012 chest x-ray.  Findings: Remote surgery involving the left glenoid with fractured screw once again noted.  New radiopaque structure at the level of the glenoid may indicate result of recent surgery.  Left total shoulder replacement appears in satisfactory position on this single view without complication noted.  Surgical drain is in place.  IMPRESSION: Left total shoulder replacement appears in satisfactory position on this single view without complication noted.  Please see above.   Original Report Authenticated By: Lacy Duverney, M.D.    Disposition: 01-Home or Self Care      Discharge Orders   Future Orders Complete By Expires     Call MD / Call 911  As directed     Comments:      If you experience chest pain or shortness of breath, CALL 911 and be transported to the hospital emergency room.  If you develope a fever above 101 F, pus (white drainage) or increased drainage or redness at the wound, or calf pain, call your surgeon's office.    Constipation Prevention  As directed      Comments:      Drink plenty of fluids.  Prune juice may be helpful.  You may use a stool softener, such as Colace (over the counter) 100 mg twice a day.  Use MiraLax (over the counter) for constipation as needed.    Constipation Prevention  As directed     Comments:      Drink plenty  of fluids.  Prune juice may be helpful.  You may use a stool softener, such as Colace (over the counter) 100 mg twice a day.  Use MiraLax (over the counter) for constipation as needed.    Diet - low sodium heart healthy  As directed     Driving restrictions  As directed     Comments:      No driving until cleared by physician.    Driving restrictions  As directed     Comments:      No driving for 6 weeks    Increase activity slowly as tolerated  As directed     Increase activity slowly as tolerated  As directed     Lifting restrictions  As directed     Comments:      No lifting until cleared by physician.       Follow-up Information   Follow up with Mable Paris, MD. Schedule an appointment as soon as possible for a visit in 2 weeks.   Contact information:   [address] [place] [address] [phone]        Signed: Mable Paris 07/23/2012, 8:16 AM

## 2012-07-23 NOTE — Evaluation (Signed)
Occupational Therapy Evaluation Patient Details Name: [name] [name] MRN: [id number] DOB: [birth year] Today's Date: 07/23/2012 Time: 9562-1308 OT Time Calculation (min): 24 min  OT Assessment / Plan / Recommendation History of present illness  Pt admitted for shoulder surgery 6/26 per Dr Ave Filter   Clinical Impression   Pt presents to OT s/p shoulder surgery/  All education complete regarding ADL activity, pendulum exercise, as well as elbowm wrist and hand ROM    OT Assessment  Progress rehab of shoulder as ordered by MD at follow-up appointment    Follow Up Recommendations  Outpatient OT       Equipment Recommendations  None recommended by OT          Precautions / Restrictions Precautions Precautions: Shoulder Shoulder Interventions: Shoulder sling/immobilizer Precaution Comments: sling on at all times except during ADL's and exercise           Visit Information  Last OT Received On: 07/23/12 Assistance Needed: +1             Cognition  Cognition Arousal/Alertness: Awake/alert Behavior During Therapy: WFL for tasks assessed/performed Overall Cognitive Status: Within Functional Limits for tasks assessed          Exercise Donning/doffing shirt without moving shoulder: Minimal assistance Method for sponge bathing under operated UE: Minimal assistance Donning/doffing sling/immobilizer: Minimal assistance Correct positioning of sling/immobilizer: Minimal assistance Pendulum exercises (written home exercise program): Independent ROM for elbow, wrist and digits of operated UE: Independent Sling wearing schedule (on at all times/off for ADL's): Independent Proper positioning of operated UE when showering: Independent Positioning of UE while sleeping: Independent      End of Session OT - End of Session Activity Tolerance: Patient tolerated treatment well Patient left: in bed (sitting EOB)  GO     [name] [name], [place] Kung 07/23/2012, 9:27 AM

## 2012-07-26 ENCOUNTER — Encounter (HOSPITAL_COMMUNITY): Payer: Self-pay | Admitting: Orthopedic Surgery

## 2014-02-15 IMAGING — CR DG SHOULDER 1V*L*
1 series · 1 of 1 positions shown · non-contrast
Comparison: 07/16/2012 chest x-ray.

CLINICAL DATA: Post left shoulder arthroplasty.

PORTABLE LEFT SHOULDER - 2+ VIEW

[ap int/ext rotation]
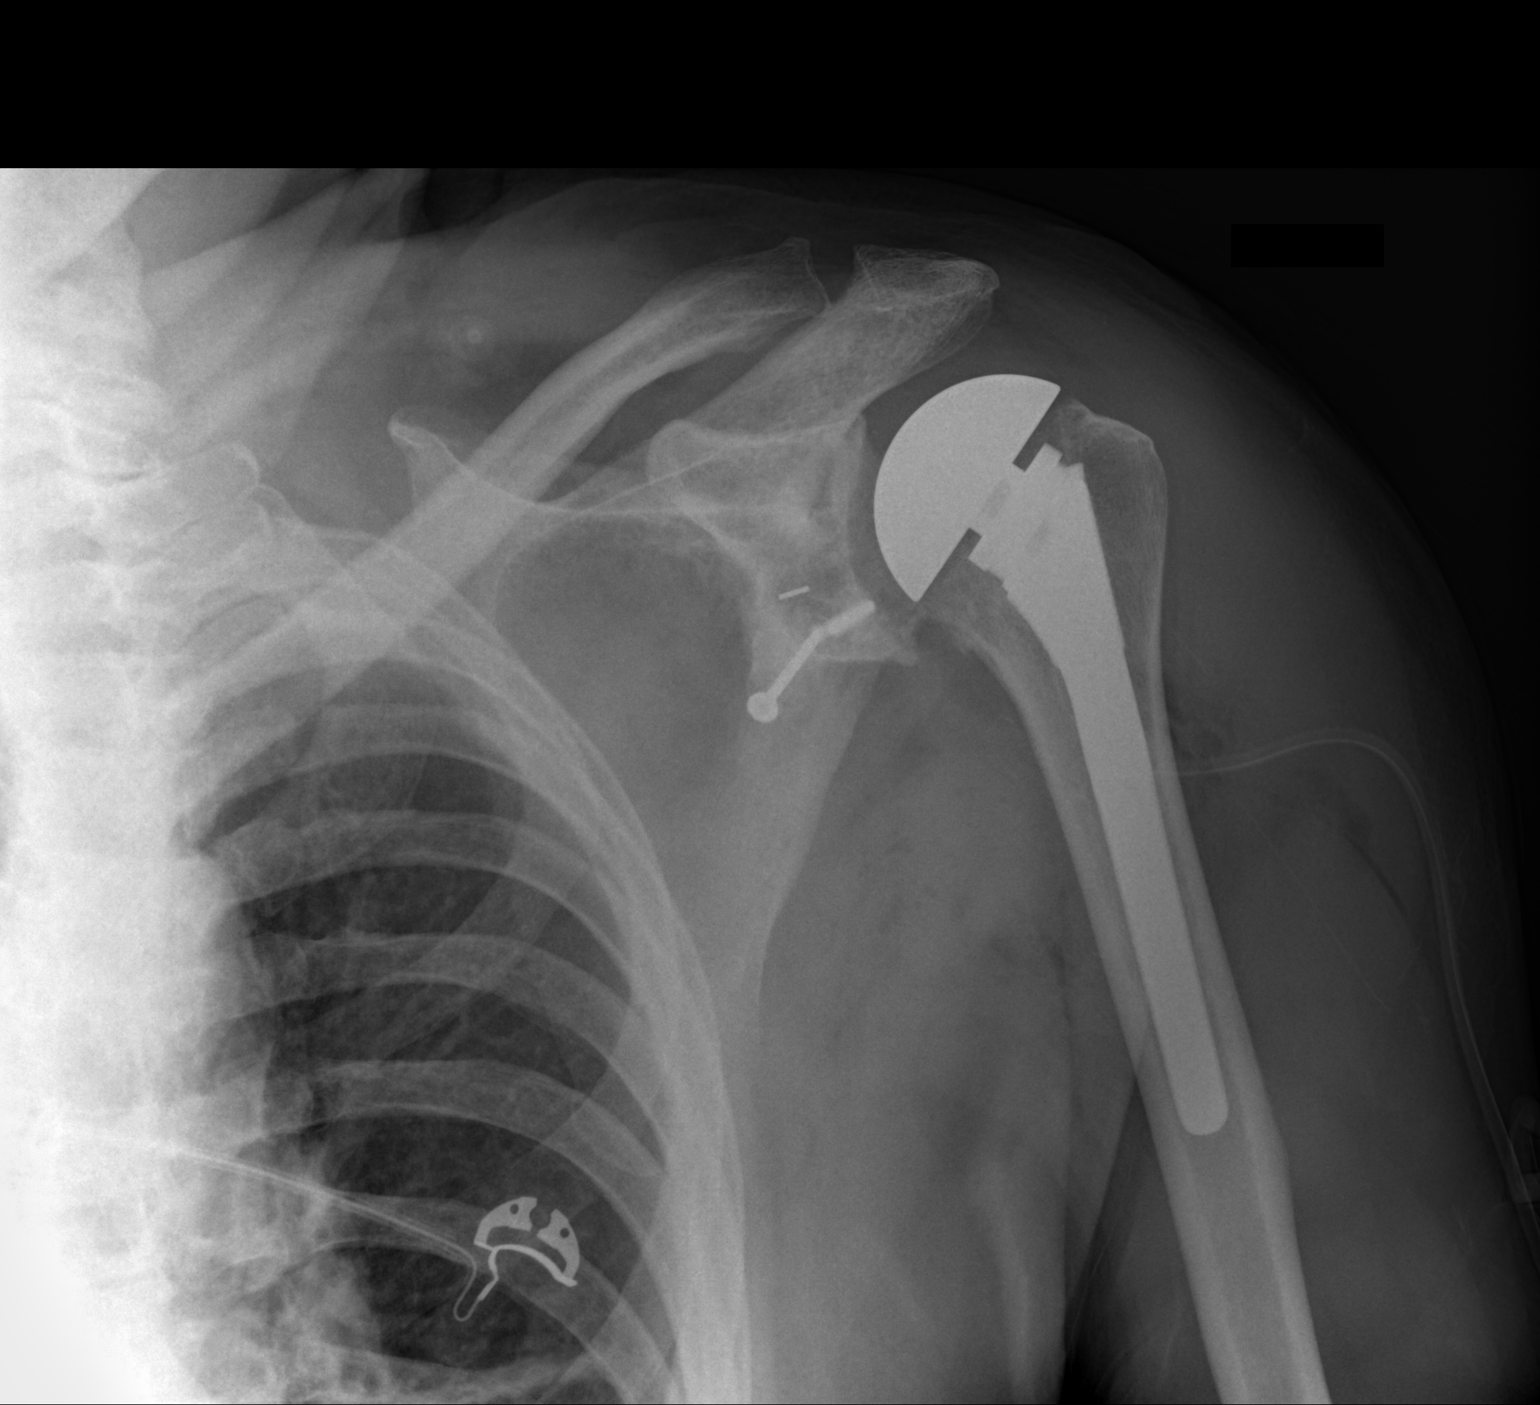

[1 of 1 positions shown; findings below may reference images not displayed]

FINDINGS: Remote surgery involving the left glenoid with fractured
screw once again noted.  New radiopaque structure at the level of
the glenoid may indicate result of recent surgery.  Left total
shoulder replacement appears in satisfactory position on this
single view without complication noted.  Surgical drain is in
place.
IMPRESSION: Left total shoulder replacement appears in satisfactory position on
this single view without complication noted.

Please see above.

## 2016-10-20 DIAGNOSIS — J9601 Acute respiratory failure with hypoxia: Secondary | ICD-10-CM

## 2016-10-20 DIAGNOSIS — I4891 Unspecified atrial fibrillation: Secondary | ICD-10-CM | POA: Diagnosis not present

## 2016-10-20 DIAGNOSIS — I10 Essential (primary) hypertension: Secondary | ICD-10-CM | POA: Diagnosis not present

## 2016-10-20 DIAGNOSIS — G35 Multiple sclerosis: Secondary | ICD-10-CM | POA: Diagnosis not present

## 2016-10-20 DIAGNOSIS — E119 Type 2 diabetes mellitus without complications: Secondary | ICD-10-CM | POA: Diagnosis not present

## 2016-10-21 DIAGNOSIS — E119 Type 2 diabetes mellitus without complications: Secondary | ICD-10-CM | POA: Diagnosis not present

## 2016-10-21 DIAGNOSIS — I10 Essential (primary) hypertension: Secondary | ICD-10-CM | POA: Diagnosis not present

## 2016-10-21 DIAGNOSIS — I4891 Unspecified atrial fibrillation: Secondary | ICD-10-CM | POA: Diagnosis not present

## 2016-10-21 DIAGNOSIS — J9601 Acute respiratory failure with hypoxia: Secondary | ICD-10-CM | POA: Diagnosis not present

## 2016-10-21 DIAGNOSIS — G35 Multiple sclerosis: Secondary | ICD-10-CM | POA: Diagnosis not present

## 2016-10-22 DIAGNOSIS — G35 Multiple sclerosis: Secondary | ICD-10-CM | POA: Diagnosis not present

## 2016-10-22 DIAGNOSIS — E119 Type 2 diabetes mellitus without complications: Secondary | ICD-10-CM | POA: Diagnosis not present

## 2016-10-22 DIAGNOSIS — I4891 Unspecified atrial fibrillation: Secondary | ICD-10-CM

## 2016-10-22 DIAGNOSIS — I10 Essential (primary) hypertension: Secondary | ICD-10-CM | POA: Diagnosis not present

## 2016-10-22 DIAGNOSIS — J9601 Acute respiratory failure with hypoxia: Secondary | ICD-10-CM | POA: Diagnosis not present

## 2016-10-23 ENCOUNTER — Inpatient Hospital Stay (HOSPITAL_COMMUNITY)
Admission: AD | Admit: 2016-10-23 | Discharge: 2016-10-25 | DRG: 308 | Disposition: A | Discharge status: Home / Self Care | Payer: Medicare Other | Source: Other Acute Inpatient Hospital | Attending: Family Medicine | Admitting: Family Medicine

## 2016-10-23 ENCOUNTER — Encounter (HOSPITAL_COMMUNITY): Payer: Self-pay | Admitting: Family Medicine

## 2016-10-23 DIAGNOSIS — Z87442 Personal history of urinary calculi: Secondary | ICD-10-CM

## 2016-10-23 DIAGNOSIS — Z87891 Personal history of nicotine dependence: Secondary | ICD-10-CM

## 2016-10-23 DIAGNOSIS — J189 Pneumonia, unspecified organism: Secondary | ICD-10-CM | POA: Diagnosis present

## 2016-10-23 DIAGNOSIS — I481 Persistent atrial fibrillation: Principal | ICD-10-CM | POA: Diagnosis present

## 2016-10-23 DIAGNOSIS — J181 Lobar pneumonia, unspecified organism: Secondary | ICD-10-CM | POA: Diagnosis not present

## 2016-10-23 DIAGNOSIS — I5022 Chronic systolic (congestive) heart failure: Secondary | ICD-10-CM

## 2016-10-23 DIAGNOSIS — E872 Acidosis: Secondary | ICD-10-CM

## 2016-10-23 DIAGNOSIS — Z8041 Family history of malignant neoplasm of ovary: Secondary | ICD-10-CM

## 2016-10-23 DIAGNOSIS — Z8249 Family history of ischemic heart disease and other diseases of the circulatory system: Secondary | ICD-10-CM | POA: Diagnosis not present

## 2016-10-23 DIAGNOSIS — R079 Chest pain, unspecified: Secondary | ICD-10-CM | POA: Diagnosis present

## 2016-10-23 DIAGNOSIS — E119 Type 2 diabetes mellitus without complications: Secondary | ICD-10-CM | POA: Diagnosis present

## 2016-10-23 DIAGNOSIS — E118 Type 2 diabetes mellitus with unspecified complications: Secondary | ICD-10-CM

## 2016-10-23 DIAGNOSIS — G4730 Sleep apnea, unspecified: Secondary | ICD-10-CM | POA: Diagnosis present

## 2016-10-23 DIAGNOSIS — R569 Unspecified convulsions: Secondary | ICD-10-CM | POA: Diagnosis present

## 2016-10-23 DIAGNOSIS — L409 Psoriasis, unspecified: Secondary | ICD-10-CM | POA: Diagnosis present

## 2016-10-23 DIAGNOSIS — Z96611 Presence of right artificial shoulder joint: Secondary | ICD-10-CM | POA: Diagnosis present

## 2016-10-23 DIAGNOSIS — I4891 Unspecified atrial fibrillation: Secondary | ICD-10-CM

## 2016-10-23 DIAGNOSIS — K219 Gastro-esophageal reflux disease without esophagitis: Secondary | ICD-10-CM | POA: Diagnosis present

## 2016-10-23 DIAGNOSIS — I10 Essential (primary) hypertension: Secondary | ICD-10-CM | POA: Diagnosis not present

## 2016-10-23 DIAGNOSIS — I110 Hypertensive heart disease with heart failure: Secondary | ICD-10-CM | POA: Diagnosis present

## 2016-10-23 DIAGNOSIS — J9601 Acute respiratory failure with hypoxia: Secondary | ICD-10-CM | POA: Diagnosis present

## 2016-10-23 DIAGNOSIS — Z96612 Presence of left artificial shoulder joint: Secondary | ICD-10-CM | POA: Diagnosis present

## 2016-10-23 DIAGNOSIS — G35 Multiple sclerosis: Secondary | ICD-10-CM

## 2016-10-23 DIAGNOSIS — I509 Heart failure, unspecified: Secondary | ICD-10-CM

## 2016-10-23 LAB — GLUCOSE, CAPILLARY: Glucose-Capillary: 113 mg/dL — ABNORMAL HIGH (ref 65–99)

## 2016-10-23 MED ORDER — DEXTROSE 5 % IV SOLN
5.0000 mg/h | INTRAVENOUS | Status: DC
  Administered 2016-10-24: 10 mg/h via INTRAVENOUS
  Filled 2016-10-23 (×2): qty 100

## 2016-10-23 MED ORDER — AMIODARONE HCL 200 MG PO TABS: 200.0000 mg | ORAL_TABLET | Freq: Every day | ORAL | Status: DC

## 2016-10-23 MED ORDER — SENNOSIDES-DOCUSATE SODIUM 8.6-50 MG PO TABS: 1.0000 | ORAL_TABLET | Freq: Every evening | ORAL | Status: DC | PRN

## 2016-10-23 MED ORDER — ACETAMINOPHEN 325 MG PO TABS: 650.0000 mg | ORAL_TABLET | Freq: Four times a day (QID) | ORAL | Status: DC | PRN

## 2016-10-23 MED ORDER — OXYCODONE-ACETAMINOPHEN 5-325 MG PO TABS
1.0000 | ORAL_TABLET | ORAL | Status: DC | PRN
  Administered 2016-10-25: 2 via ORAL
  Filled 2016-10-23: qty 2

## 2016-10-23 MED ORDER — ATORVASTATIN CALCIUM 10 MG PO TABS
10.0000 mg | ORAL_TABLET | Freq: Every day | ORAL | Status: DC
  Administered 2016-10-24: 10 mg via ORAL
  Filled 2016-10-23: qty 1

## 2016-10-23 MED ORDER — AZITHROMYCIN 500 MG IV SOLR
500.0000 mg | INTRAVENOUS | Status: DC
  Administered 2016-10-24 – 2016-10-25 (×2): 500 mg via INTRAVENOUS
  Filled 2016-10-23 (×2): qty 500

## 2016-10-23 MED ORDER — NON FORMULARY: 240.0000 mg | Freq: Two times a day (BID) | Status: DC

## 2016-10-23 MED ORDER — SODIUM CHLORIDE 0.9 % IV SOLN: 250.0000 mL | INTRAVENOUS | Status: DC | PRN

## 2016-10-23 MED ORDER — SODIUM CHLORIDE 0.9% FLUSH: 3.0000 mL | INTRAVENOUS | Status: DC | PRN

## 2016-10-23 MED ORDER — METOPROLOL SUCCINATE ER 100 MG PO TB24
100.0000 mg | ORAL_TABLET | Freq: Every day | ORAL | Status: DC
  Administered 2016-10-24: 100 mg via ORAL
  Filled 2016-10-23: qty 1

## 2016-10-23 MED ORDER — BISACODYL 5 MG PO TBEC: 5.0000 mg | DELAYED_RELEASE_TABLET | Freq: Every day | ORAL | Status: DC | PRN

## 2016-10-23 MED ORDER — FUROSEMIDE 10 MG/ML IJ SOLN
40.0000 mg | Freq: Two times a day (BID) | INTRAMUSCULAR | Status: DC
  Administered 2016-10-24 (×3): 40 mg via INTRAVENOUS
  Filled 2016-10-23 (×3): qty 4

## 2016-10-23 MED ORDER — ACETAMINOPHEN 650 MG RE SUPP: 650.0000 mg | Freq: Four times a day (QID) | RECTAL | Status: DC | PRN

## 2016-10-23 MED ORDER — ONDANSETRON HCL 4 MG/2ML IJ SOLN: 4.0000 mg | Freq: Four times a day (QID) | INTRAMUSCULAR | Status: DC | PRN

## 2016-10-23 MED ORDER — KETOROLAC TROMETHAMINE 30 MG/ML IJ SOLN: 30.0000 mg | Freq: Four times a day (QID) | INTRAMUSCULAR | Status: DC | PRN

## 2016-10-23 MED ORDER — PANTOPRAZOLE SODIUM 40 MG PO TBEC
40.0000 mg | DELAYED_RELEASE_TABLET | Freq: Every day | ORAL | Status: DC
  Administered 2016-10-24 – 2016-10-25 (×2): 40 mg via ORAL
  Filled 2016-10-23 (×2): qty 1

## 2016-10-23 MED ORDER — INSULIN ASPART 100 UNIT/ML ~~LOC~~ SOLN: 0.0000 [IU] | Freq: Every day | SUBCUTANEOUS | Status: DC

## 2016-10-23 MED ORDER — TAMSULOSIN HCL 0.4 MG PO CAPS
0.4000 mg | ORAL_CAPSULE | Freq: Every day | ORAL | Status: DC
  Administered 2016-10-24 – 2016-10-25 (×2): 0.4 mg via ORAL
  Filled 2016-10-23 (×2): qty 1

## 2016-10-23 MED ORDER — ONDANSETRON HCL 4 MG PO TABS: 4.0000 mg | ORAL_TABLET | Freq: Four times a day (QID) | ORAL | Status: DC | PRN

## 2016-10-23 MED ORDER — SODIUM CHLORIDE 0.9% FLUSH
3.0000 mL | Freq: Two times a day (BID) | INTRAVENOUS | Status: DC
  Administered 2016-10-24 – 2016-10-25 (×2): 3 mL via INTRAVENOUS

## 2016-10-23 MED ORDER — INSULIN ASPART 100 UNIT/ML ~~LOC~~ SOLN
0.0000 [IU] | Freq: Three times a day (TID) | SUBCUTANEOUS | Status: DC
  Administered 2016-10-24 – 2016-10-25 (×4): 1 [IU] via SUBCUTANEOUS

## 2016-10-23 MED ORDER — SODIUM CHLORIDE 0.9% FLUSH
3.0000 mL | Freq: Two times a day (BID) | INTRAVENOUS | Status: DC
  Administered 2016-10-24 – 2016-10-25 (×3): 3 mL via INTRAVENOUS

## 2016-10-23 MED ORDER — LISINOPRIL 5 MG PO TABS
5.0000 mg | ORAL_TABLET | Freq: Every day | ORAL | Status: DC
  Administered 2016-10-24 – 2016-10-25 (×2): 5 mg via ORAL
  Filled 2016-10-23 (×2): qty 1

## 2016-10-23 MED ORDER — CEFEPIME HCL 1 G IJ SOLR
1.0000 g | Freq: Three times a day (TID) | INTRAMUSCULAR | Status: DC
  Administered 2016-10-24 – 2016-10-25 (×5): 1 g via INTRAVENOUS
  Filled 2016-10-23 (×7): qty 1

## 2016-10-23 MED ORDER — APIXABAN 5 MG PO TABS
5.0000 mg | ORAL_TABLET | Freq: Two times a day (BID) | ORAL | Status: DC
  Administered 2016-10-24 – 2016-10-25 (×4): 5 mg via ORAL
  Filled 2016-10-23 (×4): qty 1

## 2016-10-23 NOTE — H&P (Addendum)
History and Physical    [name] [name] ZOX:[id number] DOB: 08-09-1955 DOA: 10/23/2016  PCP: Patient, No Pcp [facility]; [facility]  Patient coming from: [facility]  Chief Complaint/Reason for transfer: Atrial fib RVR despite diltiazem infusion, amio, Toprol, and cardioversion attempt x3   HPI/[facility] Course: [name] [name] is a 61 y.o. male with medical history significant for atrial fibrillation on Eliquis, type 2 diabetes mellitus, multiple sclerosis, and hypertension, who presented to the [facility] health ED on 10/20/2016 with acute shortness of breath and productive cough. He was found to be in atrial fibrillation with RVR and had a pneumonia with acute hypoxic respiratory failure requiring BiPAP. He was also having symptoms consistent with CHF.  He was admitted, started on BiPAP and diltiazem infusion, and treated with cefepime and azithromycin for his pneumonia. Respiratory status improved significantly and the patient was weaned down to 2 L/m supplemental oxygen. He underwent an echocardiogram that revealed an EF of 35%, but this was felt secondary to his atrial fibrillation with RVR.  He was loaded with amiodarone after failing the Cardizem infusion. He had already been taking Toprol 100 mg daily, which was continued. Patient was continued on amiodarone, diltiazem infusion, and Toprol, but continued to have rapid rates and cardiology was consulted. He underwent attempted cardioversion today, but this was unsuccessful despite 3 attempts. Recommendation was made for transfer to [facility] for EP consultation. The attending physician discussed the case with cardiologist here who accepted the patient in consultation, but requested a medical admission due to the pneumonia.   Patient was accepted to [facility] stepdown unit as an inpatient for ongoing evaluation and management of atrial fibrillation with RVR despite diltiazem infusion, amiodarone, Toprol, and 3 attempts at  electrical cardioversion.  Review of Systems:  All other systems reviewed and apart from HPI, are negative.  Past Medical History:  Diagnosis Date  . Arthritis   . 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 GERD (gastroesophageal reflux disease)    hx of, lost weight and it quit  . History of blood transfusion   . History of kidney stones as child  . Hyperlipidemia   . Hypertension   . Psoriasis   . Seizures (HCC) last one Dec 2013   "BLACKOUTS" STARTED AT AGE 72 - NO EVALUATIONS HAVE EVER BEEN DONE  LAST "BLACKOUT" WAS IN 2007 HAS HAD SURGERY ON L SHOULDER SINCE WITH NO PROBLEMS     Past Surgical History:  Procedure Laterality Date  . FX TIBIA  AGE 69   REPAIR OF FRACTURE  . HEAD INJURY  AGE 1   REMOVAL OF BLOOD CLOT   . SHOULDER ARTHROTOMY     REPAIR OF DISLOCATION  . 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 TOTAL SHOULDER ARTHROPLASTY  10/23/2011   Procedure: TOTAL SHOULDER ARTHROPLASTY;  Surgeon: Mable Paris, MD;  Location: [facility] ORS;  Service: Orthopedics;  Laterality: Right;  . TOTAL SHOULDER ARTHROPLASTY Left 07/22/2012   Procedure: LEFT TOTAL SHOULDER ARTHROPLASTY;  Surgeon: Mable Paris, MD;  Location: [facility] ORS;  Service: Orthopedics;  Laterality: Left;    Family history reviewed and not contributory.    Prior to Admission medications   Medication Sig Start Date End Date Taking? Authorizing Provider  amLODipine (NORVASC) 10 MG tablet Take 10 mg by mouth every morning.    [provider]  aspirin EC 81 MG tablet Take 81 mg by mouth every  morning.    [provider]  atorvastatin (LIPITOR) 40 MG tablet Take 40 mg by mouth every morning.     [provider]  Glucosamine-Fish Oil-EPA-DHA 500-400-60-40 MG CAPS Take 1 capsule by mouth daily.    [provider]  lisinopril-hydrochlorothiazide (PRINZIDE,ZESTORETIC) 20-25 MG per tablet Take 1 tablet by mouth every morning.     [provider]  oxyCODONE-acetaminophen (ROXICET) 5-325 MG per tablet Take 1-2 tablets by mouth every 4 (four) hours as needed for pain. 07/22/12   Jiles Harold, PA-C    Physical Exam: Vitals:   10/23/16 2130 10/23/16 2200  BP: (!) 126/94 130/66  Pulse: 96 88  Resp: 14   Temp: 98.3 F (36.8 C)   TempSrc: Oral   SpO2: 99% 98%  Weight: 95 kg (209 lb 8 oz)   Height:  (1.727 m)       Constitutional: NAD, calm, comfortable Eyes: PERTLA, lids and conjunctivae normal ENMT: Mucous membranes are moist. Posterior pharynx clear of any exudate or lesions.   Neck: normal, supple, no masses, no thyromegaly Respiratory: fine rales in right base, no wheezing. Normal respiratory effort.  Cardiovascular: Rate ~80 and irregular. No extremity edema. No significant JVD. Abdomen: No distension, no tenderness, no masses palpated. Bowel sounds normal.  Musculoskeletal: no clubbing / cyanosis. No joint deformity upper and lower extremities.    Skin: no significant rashes, lesions, ulcers. Warm, dry, well-perfused. Neurologic: CN 2-12 grossly intact. Sensation intact. Strength 5/5 in all 4 limbs.  Psychiatric: Alert and oriented x 3. Calm, cooperative.     Labs on Admission: I have personally reviewed following labs and imaging studies  CBC: No results for input(s): WBC, NEUTROABS, HGB, HCT, MCV, PLT in the last 168 hours. Basic Metabolic Panel: No results for input(s): NA, K, CL, CO2, GLUCOSE, BUN, CREATININE, CALCIUM, MG, PHOS in the last 168 hours. GFR: CrCl cannot be calculated (Patient's most recent lab result is older than the maximum 21 days allowed.). Liver Function Tests: No results for input(s): AST, ALT, ALKPHOS, BILITOT, PROT, ALBUMIN in the last 168 hours. No results for input(s): LIPASE, AMYLASE in the last 168 hours. No results for input(s): AMMONIA in the last 168 hours. Coagulation Profile: No results for input(s): INR, PROTIME in the last 168  hours. Cardiac Enzymes: No results for input(s): CKTOTAL, CKMB, CKMBINDEX, TROPONINI in the last 168 hours. BNP (last 3 results) No results for input(s): PROBNP in the last 8760 hours. HbA1C: No results for input(s): HGBA1C in the last 72 hours. CBG: No results for input(s): GLUCAP in the last 168 hours. Lipid Profile: No results for input(s): CHOL, HDL, LDLCALC, TRIG, CHOLHDL, LDLDIRECT in the last 72 hours. Thyroid Function Tests: No results for input(s): TSH, T4TOTAL, FREET4, T3FREE, THYROIDAB in the last 72 hours. Anemia Panel: No results for input(s): VITAMINB12, FOLATE, FERRITIN, TIBC, IRON, RETICCTPCT in the last 72 hours. Urine analysis:    Component Value Date/Time   COLORURINE YELLOW 07/16/2012 1000   APPEARANCEUR CLEAR 07/16/2012 1000   LABSPEC 1.022 07/16/2012 1000   PHURINE 5.0 07/16/2012 1000   GLUCOSEU NEGATIVE 07/16/2012 1000   HGBUR NEGATIVE 07/16/2012 1000   BILIRUBINUR NEGATIVE 07/16/2012 1000   KETONESUR NEGATIVE 07/16/2012 1000   PROTEINUR NEGATIVE 07/16/2012 1000   UROBILINOGEN 0.2 07/16/2012 1000   NITRITE NEGATIVE 07/16/2012 1000   LEUKOCYTESUR NEGATIVE 07/16/2012 1000   Sepsis Labs: (procalcitonin:4,lacticidven:4) )No results found for this or any previous visit (from the past 240 hour(s)).   Radiological Exams on Admission: No  results found.  EKG: Ordered, remains pending.   Assessment/Plan  1. Atrial fibrillation with RVR - Pt reports developing atrial fibrillation two months, with initial eval at [facility], started on Eliquis; CHADS-VASc is 3 (DM, HTN, CHF)  - He was in atrial fibrillation with RVR on admission to [place] on 10/20/16, also in acute CHF that was felt to be secondary to this  - He was started on diltiazem infusion, loaded with amio, continued on Toprol, and had persistant AF with rapid rate  - On the day of transfer, he underwent electrical cardioversion that was unsuccessful  - Cardiologist at [facility] agreed to see patient in  consultation  - He presents on diltazem infusion with good rate-control - Plan to continue diltiazem gtt, amiodarone, Toprol, Eliquis  2. Pneumonia  - Initially septic with acute hypoxic respiratory failure  - Started on azithromycin and cefepime on 9/24 and has since been weaned to 2 L supplemental oxygen and sepsis physiology has resolved  - Blood cultures with no growth to date  - Plan to continue abx to complete the course, continue to wean supplemental O2    3. Systolic CHF  - Underwent echo at [facility] with EF 35%  - Thought to be secondary to atrial fib with RVR  - Started on Lasix 40 mg IV q12h  - Pt diuresed 8 kg at [name] only slightly hypervolemic on admission  - SLIV, daily wts and I/O's, fluid-restrict diet  - Continue IV Lasix 40 mg q12h for now, can likely change to PO soon  - Continue beta-blocker and ACE    4. Type II DM  - No A1c on file  - Managed at home with metformin only, held on admission  - Check CBG's AC and HS, start low-intensity SSI with Novolog    5. Multiple sclerosis - Appears to be stable  - Managed with Tecfidera, will continue    DVT prophylaxis: Eliquis Code Status: Full  Family Communication: Discussed with patient Disposition Plan: Admit to SDU Consults called: Cardiology Admission status: Inpatient    Briscoe Deutscher, MD [facility] Pager [phone]  If 7PM-7AM, please contact night-coverage [email or web address] Password [facility]  10/23/2016, 10:53 PM

## 2016-10-24 ENCOUNTER — Encounter (HOSPITAL_COMMUNITY): Payer: Self-pay | Admitting: Family Medicine

## 2016-10-24 DIAGNOSIS — I5022 Chronic systolic (congestive) heart failure: Secondary | ICD-10-CM

## 2016-10-24 DIAGNOSIS — I4891 Unspecified atrial fibrillation: Secondary | ICD-10-CM

## 2016-10-24 LAB — COMPREHENSIVE METABOLIC PANEL
ALT: 14 U/L — ABNORMAL LOW (ref 17–63)
AST: 13 U/L — AB (ref 15–41)
Albumin: 3.3 g/dL — ABNORMAL LOW (ref 3.5–5.0)
Alkaline Phosphatase: 46 U/L (ref 38–126)
Anion gap: 10 (ref 5–15)
BUN: 15 mg/dL (ref 6–20)
CHLORIDE: 97 mmol/L — AB (ref 101–111)
CO2: 30 mmol/L (ref 22–32)
Calcium: 9 mg/dL (ref 8.9–10.3)
Creatinine, Ser: 1.01 mg/dL (ref 0.61–1.24)
GFR calc Af Amer: >60 mL/min (ref >60)
Glucose, Bld: 125 mg/dL — ABNORMAL HIGH (ref 65–99)
POTASSIUM: 3.4 mmol/L — AB (ref 3.5–5.1)
SODIUM: 137 mmol/L (ref 135–145)
Total Bilirubin: 1.1 mg/dL (ref 0.3–1.2)
Total Protein: 7.2 g/dL (ref 6.5–8.1)

## 2016-10-24 LAB — CBC
HEMATOCRIT: 41.2 % (ref 39.0–52.0)
Hemoglobin: 13.4 g/dL (ref 13.0–17.0)
MCH: 26.8 pg (ref 26.0–34.0)
MCHC: 32.5 g/dL (ref 30.0–36.0)
MCV: 82.4 fL (ref 78.0–100.0)
PLATELETS: 298 10*3/uL (ref 150–400)
RBC: 5 MIL/uL (ref 4.22–5.81)
RDW: 14.5 % (ref 11.5–15.5)
WBC: 11.7 10*3/uL — ABNORMAL HIGH (ref 4.0–10.5)

## 2016-10-24 LAB — GLUCOSE, CAPILLARY
GLUCOSE-CAPILLARY: 135 mg/dL — AB (ref 65–99)
GLUCOSE-CAPILLARY: 120 mg/dL — AB (ref 65–99)
Glucose-Capillary: 132 mg/dL — ABNORMAL HIGH (ref 65–99)
Glucose-Capillary: 149 mg/dL — ABNORMAL HIGH (ref 65–99)

## 2016-10-24 LAB — MAGNESIUM: MAGNESIUM: 2.1 mg/dL (ref 1.7–2.4)

## 2016-10-24 LAB — MRSA PCR SCREENING: MRSA BY PCR: NEGATIVE

## 2016-10-24 LAB — HIV ANTIBODY (ROUTINE TESTING W REFLEX): HIV SCREEN 4TH GENERATION: NONREACTIVE

## 2016-10-24 MED ORDER — METOPROLOL SUCCINATE ER 100 MG PO TB24
100.0000 mg | ORAL_TABLET | Freq: Two times a day (BID) | ORAL | Status: DC
  Administered 2016-10-24 – 2016-10-25 (×2): 100 mg via ORAL
  Filled 2016-10-24 (×2): qty 1

## 2016-10-24 MED ORDER — DIMETHYL FUMARATE 240 MG PO CPDR
240.0000 mg | DELAYED_RELEASE_CAPSULE | Freq: Two times a day (BID) | ORAL | Status: DC
  Administered 2016-10-24 – 2016-10-25 (×4): 240 mg via ORAL
  Filled 2016-10-24 (×4): qty 1

## 2016-10-24 MED ORDER — DILTIAZEM HCL ER COATED BEADS 240 MG PO CP24: 240.0000 mg | ORAL_CAPSULE | Freq: Every day | ORAL | Status: DC

## 2016-10-24 MED ORDER — AMIODARONE HCL 200 MG PO TABS
400.0000 mg | ORAL_TABLET | Freq: Two times a day (BID) | ORAL | Status: DC
  Administered 2016-10-24 – 2016-10-25 (×4): 400 mg via ORAL
  Filled 2016-10-24 (×4): qty 2

## 2016-10-24 MED ORDER — DILTIAZEM HCL ER COATED BEADS 120 MG PO CP24: 120.0000 mg | ORAL_CAPSULE | Freq: Every day | ORAL | Status: DC

## 2016-10-24 MED ORDER — DILTIAZEM HCL ER COATED BEADS 240 MG PO CP24
240.0000 mg | ORAL_CAPSULE | Freq: Every day | ORAL | Status: DC
  Administered 2016-10-24 – 2016-10-25 (×2): 240 mg via ORAL
  Filled 2016-10-24 (×2): qty 1

## 2016-10-24 MED ORDER — POTASSIUM CHLORIDE CRYS ER 20 MEQ PO TBCR
40.0000 meq | EXTENDED_RELEASE_TABLET | Freq: Once | ORAL | Status: AC
  Administered 2016-10-24: 40 meq via ORAL

## 2016-10-24 MED ORDER — POTASSIUM CHLORIDE CRYS ER 20 MEQ PO TBCR
30.0000 meq | EXTENDED_RELEASE_TABLET | Freq: Once | ORAL | Status: AC
  Administered 2016-10-24: 30 meq via ORAL
  Filled 2016-10-24: qty 1

## 2016-10-24 MED ORDER — FUROSEMIDE 10 MG/ML IJ SOLN
20.0000 mg | Freq: Every day | INTRAMUSCULAR | Status: DC
Start: 2016-10-24 — End: 2016-10-25
  Administered 2016-10-24 – 2016-10-25 (×2): 20 mg via INTRAVENOUS
  Filled 2016-10-24 (×2): qty 2

## 2016-10-24 NOTE — Care Management Note (Addendum)
Case Management Note  Patient Details  Name: [name] [name] MRN: [id number] Date of Birth: 05-Apr-1955  Subjective/Objective:   Pt presented for Atrial Fib- Transfer from [facility]. Pt has PCP at the [facility]. CSW for the patient is Deloris Ping- [phone] ext [phone]. Pt states he gets most of his medications from the [place].                 Action/Plan: CM did call the Transfer Coordinator April Alexander to make them aware that pt is hospitalized. CM did reach out to CSW Danielle. Once pt is d/c please fax d/c summary [phone] Attn. Dr Trudee Kuster. Pharmacist @ the [facility] is [place] [phone] ext: [phone] Pharmacy open M-F fax #  [phone]. If pt is to d/c over the weekend pt may need Rx's to be sent to a local pharmacy. Pt may benefit better from a hard copy to present to Pharmacy per CSW. No further needs from CM at this time.   Expected Discharge Date:                  Expected Discharge Plan:  Home/Self Care  In-House Referral:  NA  Discharge planning Services  CM Consult  Post Acute Care Choice:  NA Choice offered to:  NA  DME Arranged:  N/A DME Agency:  NA  HH Arranged:  NA HH Agency:  NA  Status of Service:  Completed, signed off  If discussed at Long Length of Stay Meetings, dates discussed:    Additional Comments: 1418 10-25-16 CM did fax new Rx's to the [place] for MD to review and send to patient. No further needs from CM at this time.  Gala Lewandowsky, RN 10/24/2016, 12:35 PM

## 2016-10-24 NOTE — Consult Note (Signed)
Cardiology Consultation:   Patient ID: [name] [name]; [id number]; 30-Apr-1955   Admit date: 10/23/2016 Date of Consult: 10/24/2016  Primary Care Provider: Patient, No Pcp Per Primary Cardiologist: [facility]    Patient Profile:   [name] [name] is a 61 y.o. male with a hx of fairly recent diagnosis of AFib, HTN, DM,  And MS, who is being seen today for the evaluation of  AFib at the request of Dr. Melynda Ripple.  History of Present Illness:   Mr. Minasyan presented to [facility] 9/24/ with acutely worsening SOB requiring BIPAP support initially.  (He had apparently only just been discharged from [facility] with the same AFib/RVR).  He was admitted with suspect pneumonia, CHF, and AFib with RVR 160's, started on IV diuresis, abx, and dilt gtt.  Records indicate 4 previous DCCV.  His HR remained elevated and PO amiodarone was changed to gtt.  They also supected sleep apnea.  Note on 9/25 reports pt was in SR, dilt gtt stopped and amio gtt returned to PO, metoprolol increased to 50.  On 9/26 returned to AF, given dig, BB increased further and amio gtt resumed.  10/23/16 he failed attempts to cardiovert 3 shocks.  It was decided to transfer the patient to [facility] to be evaluated by EP service.  The patient can clearly tell the difference when in AF, feels lightheaded and increased weakness/fatigue, he is not actively SOB, no CP, is aware of his AF   LABS K+ 3.4 BUN/Creat 1/01 WBC 11.7 H.H 13/41 Plts 298  Current meds Amiodarone  BID (at [facility] was at least for some period of time on amio gtt) Diltiazem gtt  Metoprolol succ  QD  AF History: Pt reports  08/27/16 1st diagnosis, started on Eliquis > on dilt outpt had DCCV failed (about 2 weeks ago) > started on amio > [place] with acute onset of  SOB  Home was taking amiodarone  BID x 1 week, then daily which is what he had been on for about a week  Past Medical History:  Diagnosis Date  . Arthritis   . 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 GERD (gastroesophageal reflux disease)    hx of, lost weight and it quit  . History of blood transfusion   . History of kidney stones as child  . Hyperlipidemia   . Hypertension   . Psoriasis   . Seizures (HCC) last one Dec 2013   "BLACKOUTS" STARTED AT AGE 89 - NO EVALUATIONS HAVE EVER BEEN DONE  LAST "BLACKOUT" WAS IN 2007 HAS HAD SURGERY ON L SHOULDER SINCE WITH NO PROBLEMS     Past Surgical History:  Procedure Laterality Date  . FX TIBIA  AGE 25   REPAIR OF FRACTURE  . HEAD INJURY  AGE 68   REMOVAL OF BLOOD CLOT   . SHOULDER ARTHROTOMY     REPAIR OF DISLOCATION  . 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 TOTAL SHOULDER ARTHROPLASTY  10/23/2011   Procedure: TOTAL SHOULDER ARTHROPLASTY;  Surgeon: Mable Paris, MD;  Location: [facility];  Service: Orthopedics;  Laterality: Right;  . TOTAL SHOULDER ARTHROPLASTY Left 07/22/2012   Procedure: LEFT TOTAL SHOULDER ARTHROPLASTY;  Surgeon: Mable Paris, MD;  Location: [facility];  Service: Orthopedics;  Laterality: Left;       Inpatient Medications: Scheduled Meds: . amiodarone  400 mg Oral BID  . apixaban  5 mg Oral BID  .  atorvastatin  10 mg Oral q1800  . diltiazem  240 mg Oral Daily  . Dimethyl Fumarate  240 mg Oral BID  . furosemide  40 mg Intravenous BID  . insulin aspart  0-5 Units Subcutaneous QHS  . insulin aspart  0-9 Units Subcutaneous TID WC  . lisinopril  5 mg Oral Daily  . metoprolol succinate  100 mg Oral Daily  . pantoprazole  40 mg Oral Daily  . sodium chloride flush  3 mL Intravenous Q12H  . sodium chloride flush  3 mL Intravenous Q12H  . tamsulosin  0.4 mg Oral QPC breakfast   Continuous Infusions: . sodium chloride    . azithromycin Stopped (10/24/16 0226)  . ceFEPime (MAXIPIME) IV 1 g (10/24/16 0509)  . diltiazem (CARDIZEM) infusion 10 mg/hr (10/24/16 0600)   PRN Meds: sodium chloride, acetaminophen **OR**  acetaminophen, bisacodyl, ketorolac, ondansetron **OR** ondansetron (ZOFRAN) IV, oxyCODONE-acetaminophen, senna-docusate, sodium chloride flush  Allergies:   No Known Allergies  Social History:   Social History   Social History  . Marital status: Legally Separated    Spouse name: N/A  . Number of children: N/A  . Years of education: N/A   Occupational History  . Not on file.   Social History Main Topics  . Smoking status: Former Smoker    Types: Cigarettes    Quit date: 10/21/1986  . Smokeless tobacco: Never Used  . Alcohol use No     Comment: RECOVERING ALCOHOLIC X 27 YRS.  . Drug use: No     Comment: NO DRUG USE X 27 YRS  . Sexual activity: Not on file   Other Topics Concern  . Not on file   Social History Narrative  . No narrative on file    Family History:   Family History  Problem Relation Age of Onset  . Heart disease Mother   . Cancer Mother        ovarian  . Heart disease Father   . Heart disease Sister      ROS:  Please see the history of present illness.  ROS  All other ROS reviewed and negative.     Physical Exam/Data:   Vitals:   10/24/16 0508 10/24/16 0725 10/24/16 0810 10/24/16 1206  BP: 102/80 110/64 112/85 120/87  Pulse: 93 91 100 88  Resp: (!) 22 20 (!) 22 20  Temp: 97.7 F (36.5 C)  98.2 F (36.8 C) 97.8 F (36.6 C)  TempSrc: Oral  Axillary Oral  SpO2: 98% 98% 99% 98%  Weight: 209 lb 4.8 oz (94.9 kg)     Height:        Intake/Output Summary (Last 24 hours) at 10/24/16 1328 Last data filed at 10/24/16 1101  Gross per 24 hour  Intake              359 ml  Output             3300 ml  Net            -2941 ml   Filed Weights   10/23/16 2130 10/24/16 0508  Weight: 209 lb 8 oz (95 kg) 209 lb 4.8 oz (94.9 kg)   Body mass index is 31.82 kg/m.  General:  Well nourished, well developed, in no acute distress HEENT: normal Lymph: no adenopathy Neck: no JVD Endocrine:  No thryomegaly Vascular: No carotid bruits  Cardiac:  IRRR; no  murmurs, gallops or rubs are appreciated Lungs:  CTA b/l, no wheezing, rhonchi or rales  Abd:  soft, nontender, no hepatomegaly  Ext: no edema Musculoskeletal:  No deformities, BUE and BLE strength normal and equal Skin: warm and dry  Neuro:  no focal abnormalities noted Psych:  Normal affect   EKG:  The EKG was personally reviewed and demonstrates:   Today is AF85bpm, QRS 86ms, QTc [place] 09/19/16 AF 161bpm  Telemetry:  Telemetry was personally reviewed and demonstrates:  AF 80's-110  Relevant CV Studies: Records from [facility] reviewed  10/20/16 TTE Moderate global hypokinesis, LVEF 35-40% Mild AV sclerosis Trace-mild MR Mod-severe TR LA 4.0cm RVSP  10/23/16 DCCV Unsuccessful , 200J, 300J, 360J, there is no mention of SR  Laboratory Data:  Chemistry  Recent Labs Lab 10/24/16 0428  NA 137  K 3.4*  CL 97*  CO2 30  GLUCOSE 125*  BUN 15  CREATININE 1.01  CALCIUM 9.0  GFRNONAA >60  GFRAA >60  ANIONGAP 10     Recent Labs Lab 10/24/16 0428  PROT 7.2  ALBUMIN 3.3*  AST 13*  ALT 14*  ALKPHOS 46  BILITOT 1.1   Hematology  Recent Labs Lab 10/24/16 0428  WBC 11.7*  RBC 5.00  HGB 13.4  HCT 41.2  MCV 82.4  MCH 26.8  MCHC 32.5  RDW 14.5  PLT 298   Cardiac EnzymesNo results for input(s): TROPONINI in the last 168 hours. No results for input(s): TROPIPOC in the last 168 hours.  BNPNo results for input(s): BNP, PROBNP in the last 168 hours.  DDimer No results for input(s): DDIMER in the last 168 hours.  Radiology/Studies:  No results found.  Assessment and Plan:   1. Persistent AFib     CHA2DS2Vasc is at least 3, on Eliquis     Difficult/failed rhythm control strategies so far     Patient is frustrated and feels like he would not want to pursue different medicine/AAD     Discussed amiodarone potential side effects, he would like to stay on this and consider ablation  Dr. Elberta Fortis discussed AFib ablation procedure, risks,  benefits and the patient feels this is the POC he would like to pursue We Babe Clenney start PO dilt his HR 80-90s currently and tray to get him off gttplan for discharge without patient follow up once able to get rate controlled with PO meds  2. CHF    Exam is euvolemic currently    For questions or updates, please contact [facility] Please consult [email or web address] for contact info under Cardiology/STEMI.   Signed, Sheilah Pigeon, PA-C  10/24/2016 1:28 PM;  I have seen and examined this patient with Francis Dowse.  Agree with above, note added to reflect my findings.  On exam, iRRR, no murmurs, lungs clear. Presented to OSH with atrial fibrillation. Attempt at cardioversion failed. On amiodarone and diltiazem.  Susy Placzek increase Toprol XL to 100 mg BID. Tiziana Cislo also increase diltiazem for rate control. As rate is controlled, would wean down the IV diltiazem. Katie Faraone plan for ablation in the future. Continue amiodarone.  Lan Entsminger M. Lehman Whiteley MD 10/24/2016 1:48 PM

## 2016-10-24 NOTE — Progress Notes (Signed)
[facility] PROGRESS NOTE  [name] [name] WUJ:[id number] DOB: Aug 27, 1955 DOA: 10/23/2016 PCP: Patient, No Pcp Per  Assessment/Plan:  1. Atrial fibrillation with RVR - Pt reports developing atrial fibrillation two months, with initial eval at [facility], started on Eliquis; CHADS-VASc is 3 (DM, HTN, CHF)  - He was in atrial fibrillation with RVR on admission to [place] on 10/20/16, also in acute CHF that was felt to be secondary to this  - He was started on diltiazem infusion, loaded with amio, continued on Toprol, and had persistant AF with rapid rate  - On the day of transfer, he underwent electrical cardioversion that was unsuccessful x2 - Cardiologist at [name] agreed to see patient in consultation  - He presents on diltazem infusion with good rate-control, plan is for outpatient ablation - Plan to continue diltiazem gtt, amiodarone, Toprol, Eliquis  2. Pneumonia  - Initially septic with acute hypoxic respiratory failure  - Started on azithromycin and cefepime on 9/24 and has since been weaned to 2 L supplemental oxygen and sepsis physiology has resolved  - Blood cultures with no growth to date  - Plan to continue abx to complete the course, continue to wean supplemental O2    3. Systolic CHF  - Underwent echo at [facility] with EF 35%  - Thought to be secondary to atrial fib with RVR  - Started on Lasix 40 mg IV q12h  - Pt diuresed 8 kg at [name] only slightly hypervolemic on admission  - SLIV, daily wts and I/O's, fluid-restrict diet  -  IV Lasix 40 mg q12h before, now on PO - Continue beta-blocker and ACE    4. Type II DM  - No A1c on file  - Managed at home with metformin only, held on admission  - Check CBG's AC and HS, start low-intensity SSI with Novolog    5. Multiple sclerosis - Appears to be stable  - Managed with Tecfidera, will continue    DVT prophylaxis: Eliquis Code Status: Full  Family Communication: Discussed with patient Disposition Plan:  Admit to SDU Consults called: Cardiology Admission status: Inpatient   Consultants:  cardio  Procedures:  none  Antibiotics:  Cefepime and Zithromax  HPI/Subjective: Feels better.  Objective: Vitals:   10/24/16 1400 10/24/16 1623  BP: (!) 128/106 115/70  Pulse:    Resp: (!) 21   Temp: 98.5 F (36.9 C) 98.3 F (36.8 C)  SpO2: 99% 98%    Intake/Output Summary (Last 24 hours) at 10/24/16 1855 Last data filed at 10/24/16 1623  Gross per 24 hour  Intake              359 ml  Output             3630 ml  Net            -3271 ml   Filed Weights   10/23/16 2130 10/24/16 0508  Weight: 95 kg (209 lb 8 oz) 94.9 kg (209 lb 4.8 oz)    Exam:   General:  NAD, NCAT  Cardiovascular: irr irr, no MRG  Respiratory: CTAB, nl wob  Data Reviewed: Basic Metabolic Panel:  Recent Labs Lab 10/24/16 0428  NA 137  K 3.4*  CL 97*  CO2 30  GLUCOSE 125*  BUN 15  CREATININE 1.01  CALCIUM 9.0  MG 2.1   Liver Function Tests:  Recent Labs Lab 10/24/16 0428  AST 13*  ALT 14*  ALKPHOS 46  BILITOT 1.1  PROT 7.2  ALBUMIN  3.3*   No results for input(s): LIPASE, AMYLASE in the last 168 hours. No results for input(s): AMMONIA in the last 168 hours. CBC:  Recent Labs Lab 10/24/16 0428  WBC 11.7*  HGB 13.4  HCT 41.2  MCV 82.4  PLT 298   Cardiac Enzymes: No results for input(s): CKTOTAL, CKMB, CKMBINDEX, TROPONINI in the last 168 hours. BNP (last 3 results) No results for input(s): BNP in the last 8760 hours.  ProBNP (last 3 results) No results for input(s): PROBNP in the last 8760 hours.  CBG:  Recent Labs Lab 10/23/16 2354 10/24/16 0809 10/24/16 1154 10/24/16 1621  GLUCAP 113* 132* 149* 135*    Recent Results (from the past 240 hour(s))  MRSA PCR Screening     Status: None   Collection Time: 10/23/16  9:26 PM  Result Value Ref Range Status   MRSA by PCR NEGATIVE NEGATIVE Final    Comment:        The GeneXpert MRSA Assay (FDA approved for NASAL  specimens only), is one component of a comprehensive MRSA colonization surveillance program. It is not intended to diagnose MRSA infection nor to guide or monitor treatment for MRSA infections.      Studies: No results found.  Scheduled Meds: . amiodarone  400 mg Oral BID  . apixaban  5 mg Oral BID  . atorvastatin  10 mg Oral q1800  . diltiazem  240 mg Oral Daily  . Dimethyl Fumarate  240 mg Oral BID  . furosemide  40 mg Intravenous BID  . insulin aspart  0-5 Units Subcutaneous QHS  . insulin aspart  0-9 Units Subcutaneous TID WC  . lisinopril  5 mg Oral Daily  . metoprolol succinate  100 mg Oral BID  . pantoprazole  40 mg Oral Daily  . sodium chloride flush  3 mL Intravenous Q12H  . sodium chloride flush  3 mL Intravenous Q12H  . tamsulosin  0.4 mg Oral QPC breakfast   Continuous Infusions: . sodium chloride    . azithromycin Stopped (10/24/16 0226)  . ceFEPime (MAXIPIME) IV Stopped (10/24/16 1430)  . diltiazem (CARDIZEM) infusion 10 mg/hr (10/24/16 0600)    Principal Problem:   Atrial fibrillation with RVR (HCC) Active Problems:   Pneumonia   Chronic systolic CHF (congestive heart failure) (HCC)   DM II (diabetes mellitus, type II), controlled (HCC)   Multiple sclerosis (HCC)   Chest pain    Time spent: 30    [name] [name]  [facility] Pager AMION. If 7PM-7AM, please contact night-coverage at [email or web address], password [name] [name] 10/24/2016, 6:55 PM  LOS: 1 day

## 2016-10-25 DIAGNOSIS — J181 Lobar pneumonia, unspecified organism: Secondary | ICD-10-CM

## 2016-10-25 LAB — GLUCOSE, CAPILLARY
GLUCOSE-CAPILLARY: 97 mg/dL (ref 65–99)
Glucose-Capillary: 126 mg/dL — ABNORMAL HIGH (ref 65–99)

## 2016-10-25 MED ORDER — AMIODARONE HCL 200 MG PO TABS: 200.0000 mg | ORAL_TABLET | Freq: Two times a day (BID) | ORAL | 0 refills | Status: AC

## 2016-10-25 MED ORDER — LISINOPRIL 5 MG PO TABS: 5.0000 mg | ORAL_TABLET | Freq: Every day | ORAL | 0 refills | Status: AC

## 2016-10-25 MED ORDER — PANTOPRAZOLE SODIUM 40 MG PO TBEC: 40.0000 mg | DELAYED_RELEASE_TABLET | Freq: Every day | ORAL | 0 refills | Status: AC

## 2016-10-25 MED ORDER — ONDANSETRON HCL 4 MG PO TABS: 4.0000 mg | ORAL_TABLET | Freq: Four times a day (QID) | ORAL | 0 refills | Status: AC | PRN

## 2016-10-25 MED ORDER — CEFDINIR 300 MG PO CAPS: 300.0000 mg | ORAL_CAPSULE | Freq: Two times a day (BID) | ORAL | 0 refills | Status: AC

## 2016-10-25 MED ORDER — AZITHROMYCIN 250 MG PO TABS: 250.0000 mg | ORAL_TABLET | Freq: Every day | ORAL | 0 refills | Status: AC

## 2016-10-25 MED ORDER — AZITHROMYCIN 250 MG PO TABS: 250.0000 mg | ORAL_TABLET | Freq: Every day | ORAL | Status: DC

## 2016-10-25 MED ORDER — LISINOPRIL 5 MG PO TABS: 5.0000 mg | ORAL_TABLET | Freq: Every day | ORAL | 0 refills | Status: DC

## 2016-10-25 MED ORDER — ALBUTEROL SULFATE HFA 108 (90 BASE) MCG/ACT IN AERS: 2.0000 | INHALATION_SPRAY | Freq: Four times a day (QID) | RESPIRATORY_TRACT | 0 refills | Status: AC | PRN

## 2016-10-25 MED ORDER — GUAIFENESIN ER 600 MG PO TB12: 600.0000 mg | ORAL_TABLET | Freq: Two times a day (BID) | ORAL | 0 refills | Status: AC

## 2016-10-25 MED ORDER — AMIODARONE HCL 400 MG PO TABS: 400.0000 mg | ORAL_TABLET | Freq: Two times a day (BID) | ORAL | 0 refills | Status: AC

## 2016-10-25 NOTE — Discharge Summary (Signed)
Physician Discharge Summary  [name] [name] ZOX:[id number] DOB: 12/10/1955 DOA: 10/23/2016  PCP: Patient, No Pcp Per  Admit date: 10/23/2016 Discharge date: 10/25/2016  Time spent: 32 minutes  Recommendations for Outpatient Follow-up:  1. F/u with cardio 2. F/u with PCP   Discharge Diagnoses:  Principal Problem:   Atrial fibrillation with RVR (HCC) Active Problems:   Pneumonia   Chronic systolic CHF (congestive heart failure) (HCC)   DM II (diabetes mellitus, type II), controlled (HCC)   Multiple sclerosis (HCC)   Chest pain   Discharge Condition: stable  Diet recommendation: heart healthy  Filed Weights   10/23/16 2130 10/24/16 0508 10/25/16 0527  Weight: 95 kg (209 lb 8 oz) 94.9 kg (209 lb 4.8 oz) 93.3 kg (205 lb 9.6 oz)    History of present illness:  Chief Complaint/Reason for transfer: Atrial fib RVR despite diltiazem infusion, amio, Toprol, and cardioversion attempt x3   HPI/[place] [facility] Course: [name] [name] is a 61 y.o. male with medical history significant for atrial fibrillation on Eliquis, type 2 diabetes mellitus, multiple sclerosis, and hypertension, who presented to the [facility] health ED on 10/20/2016 with acute shortness of breath and productive cough. He was found to be in atrial fibrillation with RVR and had a pneumonia with acute hypoxic respiratory failure requiring BiPAP. He was also having symptoms consistent with CHF.  He was admitted, started on BiPAP and diltiazem infusion, and treated with cefepime and azithromycin for his pneumonia. Respiratory status improved significantly and the patient was weaned down to 2 L/m supplemental oxygen. He underwent an echocardiogram that revealed an EF of 35%, but this was felt secondary to his atrial fibrillation with RVR.  He was loaded with amiodarone after failing the Cardizem infusion. He had already been taking Toprol 100 mg daily, which was continued. Patient was continued on amiodarone, diltiazem infusion,  and Toprol, but continued to have rapid rates and cardiology was consulted. He underwent attempted cardioversion today, but this was unsuccessful despite 3 attempts. Recommendation was made for transfer to [facility] for EP consultation. The attending physician discussed the case with cardiologist here who accepted the patient in consultation, but requested a medical admission due to the pneumonia.   Patient was accepted to [facility] stepdown unit as an inpatient for ongoing evaluation and management of atrial fibrillation with RVR despite diltiazem infusion, amiodarone, Toprol, and 3 attempts at electrical cardioversion.  Hospital Course:  Dilt drip used to control RVR afib. Vanc and cefepime for pna. Brief diuresis with IV lasix. Weaned of drip and toprol XL & diltiazem increased.  Cardio - Rec ablation as OP. Blood cultures done at outside hospital were negative 2. MRSA swab negative.  Procedures:   none  Consultations:  cardio  Discharge Exam: Vitals:   10/25/16 0746 10/25/16 1100  BP:    Pulse:    Resp:    Temp: 97.9 F (36.6 C) (!) 97.1 F (36.2 C)  SpO2:      General: NAD, NCAT Cardiovascular: irr RR, no MRG Respiratory: CTAB, nl WOB  Discharge Instructions    Discharge Medication List as of 10/25/2016  2:48 PM    START taking these medications   Details  albuterol (PROVENTIL HFA;VENTOLIN HFA) 108 (90 Base) MCG/ACT inhaler Inhale 2 puffs into the lungs every 6 (six) hours as needed for wheezing or shortness of breath., Starting Sat 10/25/2016, Print    !! amiodarone (PACERONE) 200 MG tablet Take 1 tablet (200 mg total) by mouth 2 (two) times daily., Starting  Fri 11/07/2016, Until Fri 11/21/2016, Print    !! amiodarone (PACERONE) 400 MG tablet Take 1 tablet (400 mg total) by mouth 2 (two) times daily., Starting Sat 10/25/2016, Until Sat 11/08/2016, Print    azithromycin (ZITHROMAX) 250 MG tablet Take 1 tablet (250 mg total) by mouth daily.,  Starting Sat 10/25/2016, Print    cefdinir (OMNICEF) 300 MG capsule Take 1 capsule (300 mg total) by mouth 2 (two) times daily., Starting Sat 10/25/2016, Until Fri 10/31/2016, Print    guaiFENesin (MUCINEX) 600 MG 12 hr tablet Take 1 tablet (600 mg total) by mouth 2 (two) times daily., Starting Sat 10/25/2016, Until Sat 11/01/2016, Print    ondansetron (ZOFRAN) 4 MG tablet Take 1 tablet (4 mg total) by mouth every 6 (six) hours as needed for nausea., Starting Sat 10/25/2016, Print    pantoprazole (PROTONIX) 40 MG tablet Take 1 tablet (40 mg total) by mouth daily., Starting Sat 10/25/2016, Until Fri 11/14/2016, Print     !! - Potential duplicate medications found. Please discuss with provider.    CONTINUE these medications which have CHANGED   Details  lisinopril (PRINIVIL,ZESTRIL) 5 MG tablet Take 1 tablet (5 mg total) by mouth daily., Starting Sat 10/25/2016, Until Fri 11/14/2016, Print      CONTINUE these medications which have NOT CHANGED   Details  !! amiodarone (PACERONE) 400 MG tablet Take 400 mg by mouth 2 (two) times daily. To continue until 9/30 and switch to 400 mg daily, Historical Med    apixaban (ELIQUIS) 5 MG TABS tablet Take 5 mg by mouth 2 (two) times daily., Historical Med    atorvastatin (LIPITOR) 10 MG tablet Take 10 mg by mouth daily., Historical Med    diltiazem (DILACOR XR) 120 MG 24 hr capsule Take 240 mg by mouth daily., Historical Med    Dimethyl Fumarate (TECFIDERA) 240 MG CPDR Take 240 mg by mouth 2 (two) times daily., Historical Med    metFORMIN (GLUCOPHAGE) 500 MG tablet Take by mouth daily with breakfast., Historical Med    Omega-3 Fatty Acids (FISH OIL) 1000 MG CAPS Take 1 capsule by mouth daily., Historical Med    omeprazole (PRILOSEC) 20 MG capsule Take 20 mg by mouth daily., Historical Med    tamsulosin (FLOMAX) 0.4 MG CAPS capsule Take 0.4 mg by mouth daily after breakfast., Historical Med     !! - Potential duplicate medications found. Please discuss  with provider.    STOP taking these medications     oxyCODONE-acetaminophen (ROXICET) 5-325 MG per tablet        No Known Allergies    The results of significant diagnostics from this hospitalization (including imaging, microbiology, ancillary and laboratory) are listed below for reference.    Significant Diagnostic Studies: No results found.  Microbiology: Recent Results (from the past 240 hour(s))  MRSA PCR Screening     Status: None   Collection Time: 10/23/16  9:26 PM  Result Value Ref Range Status   MRSA by PCR NEGATIVE NEGATIVE Final    Comment:        The GeneXpert MRSA Assay (FDA approved for NASAL specimens only), is one component of a comprehensive MRSA colonization surveillance program. It is not intended to diagnose MRSA infection nor to guide or monitor treatment for MRSA infections.      Labs: Basic Metabolic Panel:  Recent Labs Lab 10/24/16 0428  NA 137  K 3.4*  CL 97*  CO2 30  GLUCOSE 125*  BUN 15  CREATININE 1.01  CALCIUM  9.0  MG 2.1   Liver Function Tests:  Recent Labs Lab 10/24/16 0428  AST 13*  ALT 14*  ALKPHOS 46  BILITOT 1.1  PROT 7.2  ALBUMIN 3.3*   No results for input(s): LIPASE, AMYLASE in the last 168 hours. No results for input(s): AMMONIA in the last 168 hours. CBC:  Recent Labs Lab 10/24/16 0428  WBC 11.7*  HGB 13.4  HCT 41.2  MCV 82.4  PLT 298   Cardiac Enzymes: No results for input(s): CKTOTAL, CKMB, CKMBINDEX, TROPONINI in the last 168 hours. BNP: BNP (last 3 results) No results for input(s): BNP in the last 8760 hours.  ProBNP (last 3 results) No results for input(s): PROBNP in the last 8760 hours.  CBG:  Recent Labs Lab 10/24/16 1154 10/24/16 1621 10/24/16 2010 10/25/16 0752 10/25/16 1201  GLUCAP 149* 135* 120* 126* 97       Signed:  [name] MD  FACP  [facility] 10/25/2016, 12:26 PM

## 2016-10-25 NOTE — Progress Notes (Signed)
Progress Note  Patient Name: [name] [name] Date of Encounter: 10/25/2016  Primary Cardiologist: [facility]  Subjective   Feeling well today. Converted to sinus rhythm yesterday with diltiazem and amiodarone. Much less symptoms of fatigue than when he was in atrial fibrillation.  Inpatient Medications    Scheduled Meds: . amiodarone  400 mg Oral BID  . apixaban  5 mg Oral BID  . atorvastatin  10 mg Oral q1800  . diltiazem  240 mg Oral Daily  . Dimethyl Fumarate  240 mg Oral BID  . furosemide  20 mg Intravenous Daily  . insulin aspart  0-5 Units Subcutaneous QHS  . insulin aspart  0-9 Units Subcutaneous TID WC  . lisinopril  5 mg Oral Daily  . metoprolol succinate  100 mg Oral BID  . pantoprazole  40 mg Oral Daily  . sodium chloride flush  3 mL Intravenous Q12H  . sodium chloride flush  3 mL Intravenous Q12H  . tamsulosin  0.4 mg Oral QPC breakfast   Continuous Infusions: . sodium chloride    . azithromycin Stopped (10/25/16 0321)  . ceFEPime (MAXIPIME) IV 1 g (10/25/16 0521)  . diltiazem (CARDIZEM) infusion 10 mg/hr (10/24/16 0600)   PRN Meds: sodium chloride, acetaminophen **OR** acetaminophen, bisacodyl, ketorolac, ondansetron **OR** ondansetron (ZOFRAN) IV, oxyCODONE-acetaminophen, senna-docusate, sodium chloride flush   Vital Signs    Vitals:   10/25/16 0000 10/25/16 0525 10/25/16 0527 10/25/16 0746  BP: 134/83 (!) 147/70    Pulse: 83 73    Resp: 18 18    Temp: 99 F (37.2 C) 97.9 F (36.6 C)  97.9 F (36.6 C)  TempSrc: Oral Oral  Oral  SpO2: 95% 95%    Weight:   205 lb 9.6 oz (93.3 kg)   Height:        Intake/Output Summary (Last 24 hours) at 10/25/16 0900 Last data filed at 10/25/16 0700  Gross per 24 hour  Intake              363 ml  Output             2080 ml  Net            -1717 ml   Filed Weights   10/23/16 2130 10/24/16 0508 10/25/16 0527  Weight: 209 lb 8 oz (95 kg) 209 lb 4.8 oz (94.9 kg) 205 lb 9.6 oz (93.3 kg)    Telemetry      Sinus rhythm - Personally Reviewed  ECG    Sinus rhythm - Personally Reviewed  Physical Exam   GEN: No acute distress.   Neck: No JVD Cardiac: RRR, no murmurs, rubs, or gallops.  Respiratory: Clear to auscultation bilaterally. GI: Soft, nontender, non-distended  MS: No edema; No deformity. Neuro:  Nonfocal  Psych: Normal affect   Labs    Chemistry Recent Labs Lab 10/24/16 0428  NA 137  K 3.4*  CL 97*  CO2 30  GLUCOSE 125*  BUN 15  CREATININE 1.01  CALCIUM 9.0  PROT 7.2  ALBUMIN 3.3*  AST 13*  ALT 14*  ALKPHOS 46  BILITOT 1.1  GFRNONAA >60  GFRAA >60  ANIONGAP 10     Hematology Recent Labs Lab 10/24/16 0428  WBC 11.7*  RBC 5.00  HGB 13.4  HCT 41.2  MCV 82.4  MCH 26.8  MCHC 32.5  RDW 14.5  PLT 298    Cardiac EnzymesNo results for input(s): TROPONINI in the last 168 hours. No results for input(s): TROPIPOC in the  last 168 hours.   BNPNo results for input(s): BNP, PROBNP in the last 168 hours.   DDimer No results for input(s): DDIMER in the last 168 hours.   Radiology    No results found.  Cardiac Studies   10/20/16 TTE Moderate global hypokinesis, LVEF 35-40% Mild AV sclerosis Trace-mild MR Mod-severe TR LA 4.0cm RVSP  Patient Profile     61 y.o. male who presented to the hospital with rapid atrial fibrillation and had unsuccessful cardioversion 3. Echo with reduced ejection fraction of 30-35%.  Assessment & Plan    1. Persistent AFib Converted to sinus rhythm yesterday. We'll continue amiodarone at 400 mg twice a day for 2 weeks followed by 200 mg twice a day for 2 weeks followed by 200 mg a day. I did discuss with him the possibility of ablation in the future which she is agreeable to. We'll plan have him follow-up in clinic for further discussion.okay to discharge per EP perspective. EP to sign off. Call back as needed.  This patients CHA2DS2-VASc Score and unadjusted Ischemic Stroke Rate (% per year) is equal to 3.2 %  stroke rate/year from a score of 3  Above score calculated as 1 point each if present [CHF, HTN, DM, Vascular=MI/PAD/Aortic Plaque, Age if 65-74, or Male] Above score calculated as 2 points each if present [Age > 75, or Stroke/TIA/TE]    2. CHF   Ejection fraction is reduced on echocardiogram done at [facility]. Possibly due to rapid atrial fibrillation. [facility] repeat as an outpatient.  For questions or updates, please contact [facility] Please consult [email or web address] for contact info under Cardiology/STEMI.      Signed, Zitlali Primm Jorja Loa, MD  10/25/2016, 9:00 AM

## 2016-10-25 NOTE — Progress Notes (Signed)
Pt ambulated hallway 240 ft, tolerated well O2 sats above 96.

## 2016-11-19 ENCOUNTER — Telehealth: Payer: Self-pay | Admitting: Cardiology

## 2016-11-19 ENCOUNTER — Encounter: Payer: Self-pay | Admitting: *Deleted

## 2016-11-19 NOTE — Telephone Encounter (Signed)
Following up on RFA discussed w/ [place] in the hospital last month.   Pt tells me that the [place] has "taken over". Reports scheduled cath with them November 6 & 7.  States that they will be addressing/performing ablation after cath. Asked pt to call the office back if they are not able to perform ablation and he still wishes to proceed. Pt is agreeable to plan.

## 2016-11-19 NOTE — Telephone Encounter (Signed)
New message     Please call he is returning a call from someone at the office , he was told everything was turned back over to the [place] ?

## 2019-08-07 DIAGNOSIS — E669 Obesity, unspecified: Secondary | ICD-10-CM | POA: Diagnosis not present

## 2019-08-07 DIAGNOSIS — H547 Unspecified visual loss: Secondary | ICD-10-CM | POA: Diagnosis not present

## 2019-08-07 DIAGNOSIS — G4730 Sleep apnea, unspecified: Secondary | ICD-10-CM | POA: Diagnosis not present

## 2019-08-07 DIAGNOSIS — G35 Multiple sclerosis: Secondary | ICD-10-CM | POA: Diagnosis not present

## 2019-08-07 DIAGNOSIS — D6869 Other thrombophilia: Secondary | ICD-10-CM | POA: Diagnosis not present

## 2019-08-07 DIAGNOSIS — E785 Hyperlipidemia, unspecified: Secondary | ICD-10-CM | POA: Diagnosis not present

## 2019-08-07 DIAGNOSIS — I4891 Unspecified atrial fibrillation: Secondary | ICD-10-CM | POA: Diagnosis not present

## 2019-08-07 DIAGNOSIS — E1142 Type 2 diabetes mellitus with diabetic polyneuropathy: Secondary | ICD-10-CM | POA: Diagnosis not present

## 2019-08-07 DIAGNOSIS — D649 Anemia, unspecified: Secondary | ICD-10-CM | POA: Diagnosis not present

## 2019-08-07 DIAGNOSIS — I10 Essential (primary) hypertension: Secondary | ICD-10-CM | POA: Diagnosis not present

## 2021-10-17 ENCOUNTER — Ambulatory Visit: Payer: Medicare Other | Admitting: Podiatry
